# Patient Record
Sex: Male | Born: 1950 | Race: White | Hispanic: No | Marital: Married | Smoking: Never smoker
Health system: Southern US, Community
[De-identification: ages and names within clinical notes are randomized; demographics above are authoritative.]

## PROBLEM LIST (undated history)

## (undated) DIAGNOSIS — R7302 Impaired glucose tolerance (oral): Secondary | ICD-10-CM

## (undated) DIAGNOSIS — I2510 Atherosclerotic heart disease of native coronary artery without angina pectoris: Secondary | ICD-10-CM

## (undated) DIAGNOSIS — Z8679 Personal history of other diseases of the circulatory system: Secondary | ICD-10-CM

## (undated) DIAGNOSIS — C801 Malignant (primary) neoplasm, unspecified: Secondary | ICD-10-CM

## (undated) DIAGNOSIS — K5730 Diverticulosis of large intestine without perforation or abscess without bleeding: Secondary | ICD-10-CM

## (undated) DIAGNOSIS — J302 Other seasonal allergic rhinitis: Secondary | ICD-10-CM

## (undated) DIAGNOSIS — R55 Syncope and collapse: Secondary | ICD-10-CM

## (undated) DIAGNOSIS — E785 Hyperlipidemia, unspecified: Secondary | ICD-10-CM

## (undated) DIAGNOSIS — N529 Male erectile dysfunction, unspecified: Secondary | ICD-10-CM

## (undated) DIAGNOSIS — Z923 Personal history of irradiation: Secondary | ICD-10-CM

## (undated) DIAGNOSIS — M75102 Unspecified rotator cuff tear or rupture of left shoulder, not specified as traumatic: Secondary | ICD-10-CM

## (undated) DIAGNOSIS — R399 Unspecified symptoms and signs involving the genitourinary system: Secondary | ICD-10-CM

## (undated) DIAGNOSIS — J310 Chronic rhinitis: Secondary | ICD-10-CM

## (undated) DIAGNOSIS — R7303 Prediabetes: Secondary | ICD-10-CM

## (undated) DIAGNOSIS — G43909 Migraine, unspecified, not intractable, without status migrainosus: Secondary | ICD-10-CM

## (undated) DIAGNOSIS — C61 Malignant neoplasm of prostate: Secondary | ICD-10-CM

## (undated) DIAGNOSIS — Z87898 Personal history of other specified conditions: Secondary | ICD-10-CM

## (undated) DIAGNOSIS — Z8719 Personal history of other diseases of the digestive system: Secondary | ICD-10-CM

## (undated) DIAGNOSIS — M1990 Unspecified osteoarthritis, unspecified site: Secondary | ICD-10-CM

## (undated) DIAGNOSIS — Z973 Presence of spectacles and contact lenses: Secondary | ICD-10-CM

## (undated) DIAGNOSIS — E663 Overweight: Secondary | ICD-10-CM

## (undated) DIAGNOSIS — R053 Chronic cough: Secondary | ICD-10-CM

## (undated) HISTORY — DX: Prediabetes: R73.03

## (undated) HISTORY — DX: Impaired glucose tolerance (oral): R73.02

## (undated) HISTORY — DX: Hyperlipidemia, unspecified: E78.5

## (undated) HISTORY — DX: Migraine, unspecified, not intractable, without status migrainosus: G43.909

## (undated) HISTORY — PX: SPINE SURGERY: SHX786

## (undated) HISTORY — PX: ACHILLES TENDON REPAIR: SUR1153

## (undated) HISTORY — PX: TONSILLECTOMY: SUR1361

## (undated) HISTORY — DX: Other seasonal allergic rhinitis: J30.2

## (undated) HISTORY — PX: COLONOSCOPY: SHX174

## (undated) HISTORY — DX: Unspecified rotator cuff tear or rupture of left shoulder, not specified as traumatic: M75.102

## (undated) HISTORY — DX: Syncope and collapse: R55

## (undated) HISTORY — PX: VASECTOMY: SHX75

## (undated) HISTORY — DX: Male erectile dysfunction, unspecified: N52.9

## (undated) HISTORY — DX: Overweight: E66.3

---

## 1994-12-15 DIAGNOSIS — Z87442 Personal history of urinary calculi: Secondary | ICD-10-CM

## 1994-12-15 HISTORY — DX: Personal history of urinary calculi: Z87.442

## 2002-08-23 ENCOUNTER — Ambulatory Visit (HOSPITAL_COMMUNITY): Admission: RE | Admit: 2002-08-23 | Discharge: 2002-08-23 | Payer: Self-pay | Admitting: Gastroenterology

## 2006-02-17 ENCOUNTER — Ambulatory Visit (HOSPITAL_BASED_OUTPATIENT_CLINIC_OR_DEPARTMENT_OTHER): Admission: RE | Admit: 2006-02-17 | Discharge: 2006-02-17 | Payer: Self-pay | Admitting: Orthopedic Surgery

## 2006-02-17 HISTORY — PX: ACHILLES TENDON REPAIR: SUR1153

## 2009-09-14 ENCOUNTER — Encounter: Payer: Self-pay | Admitting: Emergency Medicine

## 2009-09-14 ENCOUNTER — Ambulatory Visit: Payer: Self-pay | Admitting: Diagnostic Radiology

## 2009-09-15 ENCOUNTER — Inpatient Hospital Stay (HOSPITAL_COMMUNITY): Admission: EM | Admit: 2009-09-15 | Discharge: 2009-09-17 | Payer: Self-pay | Admitting: Internal Medicine

## 2009-09-15 ENCOUNTER — Encounter (INDEPENDENT_AMBULATORY_CARE_PROVIDER_SITE_OTHER): Payer: Self-pay | Admitting: Internal Medicine

## 2009-09-17 ENCOUNTER — Encounter (INDEPENDENT_AMBULATORY_CARE_PROVIDER_SITE_OTHER): Payer: Self-pay | Admitting: Internal Medicine

## 2009-09-17 ENCOUNTER — Ambulatory Visit: Payer: Self-pay | Admitting: Vascular Surgery

## 2009-12-27 ENCOUNTER — Ambulatory Visit (HOSPITAL_COMMUNITY): Admission: RE | Admit: 2009-12-27 | Discharge: 2009-12-28 | Payer: Self-pay | Admitting: Neurosurgery

## 2009-12-27 HISTORY — PX: ANTERIOR CERVICAL DECOMP/DISCECTOMY FUSION: SHX1161

## 2010-02-14 ENCOUNTER — Encounter: Admission: RE | Admit: 2010-02-14 | Discharge: 2010-02-14 | Payer: Self-pay | Admitting: Neurosurgery

## 2011-03-02 LAB — GLUCOSE, CAPILLARY
Glucose-Capillary: 117 mg/dL — ABNORMAL HIGH (ref 70–99)
Glucose-Capillary: 141 mg/dL — ABNORMAL HIGH (ref 70–99)
Glucose-Capillary: 170 mg/dL — ABNORMAL HIGH (ref 70–99)
Glucose-Capillary: 177 mg/dL — ABNORMAL HIGH (ref 70–99)
Glucose-Capillary: 219 mg/dL — ABNORMAL HIGH (ref 70–99)

## 2011-03-02 LAB — CBC
Hemoglobin: 15.7 g/dL (ref 13.0–17.0)
MCHC: 34.8 g/dL (ref 30.0–36.0)
Platelets: 244 10*3/uL (ref 150–400)
RBC: 5.14 MIL/uL (ref 4.22–5.81)

## 2011-03-02 LAB — BASIC METABOLIC PANEL
Calcium: 8.8 mg/dL (ref 8.4–10.5)
Creatinine, Ser: 0.75 mg/dL (ref 0.4–1.5)

## 2011-03-20 LAB — COMPREHENSIVE METABOLIC PANEL
AST: 23 U/L (ref 0–37)
Albumin: 4.7 g/dL (ref 3.5–5.2)
Calcium: 9.8 mg/dL (ref 8.4–10.5)
Chloride: 103 mEq/L (ref 96–112)
Creatinine, Ser: 0.9 mg/dL (ref 0.4–1.5)
GFR calc Af Amer: >60 mL/min (ref >60)
Total Protein: 7.9 g/dL (ref 6.0–8.3)

## 2011-03-20 LAB — GLUCOSE, CAPILLARY
Glucose-Capillary: 167 mg/dL — ABNORMAL HIGH (ref 70–99)
Glucose-Capillary: 170 mg/dL — ABNORMAL HIGH (ref 70–99)
Glucose-Capillary: 176 mg/dL — ABNORMAL HIGH (ref 70–99)
Glucose-Capillary: 182 mg/dL — ABNORMAL HIGH (ref 70–99)
Glucose-Capillary: 106 mg/dL — ABNORMAL HIGH (ref 70–99)

## 2011-03-20 LAB — CBC
HCT: 41.5 % (ref 39.0–52.0)
MCHC: 34.9 g/dL (ref 30.0–36.0)
MCV: 89.3 fL (ref 78.0–100.0)
Platelets: 224 10*3/uL (ref 150–400)
Platelets: 268 10*3/uL (ref 150–400)
RBC: 4.63 MIL/uL (ref 4.22–5.81)
WBC: 7.1 10*3/uL (ref 4.0–10.5)
WBC: 19.3 10*3/uL — ABNORMAL HIGH (ref 4.0–10.5)

## 2011-03-20 LAB — CARDIAC PANEL(CRET KIN+CKTOT+MB+TROPI)
CK, MB: 1.6 ng/mL (ref 0.3–4.0)
Relative Index: 1.4 (ref 0.0–2.5)
Relative Index: 1.7 (ref 0.0–2.5)
Total CK: 127 U/L (ref 7–232)

## 2011-03-20 LAB — URINALYSIS, ROUTINE W REFLEX MICROSCOPIC
Bilirubin Urine: NEGATIVE
Nitrite: NEGATIVE
Specific Gravity, Urine: 1.021 (ref 1.005–1.030)
pH: 5.5 (ref 5.0–8.0)

## 2011-03-20 LAB — BASIC METABOLIC PANEL
BUN: 22 mg/dL (ref 6–23)
Calcium: 8.2 mg/dL — ABNORMAL LOW (ref 8.4–10.5)
Chloride: 108 mEq/L (ref 96–112)
GFR calc Af Amer: >60 mL/min (ref >60)
GFR calc non Af Amer: >60 mL/min (ref >60)
Glucose, Bld: 174 mg/dL — ABNORMAL HIGH (ref 70–99)
Potassium: 3 mEq/L — ABNORMAL LOW (ref 3.5–5.1)
Potassium: 4.1 mEq/L (ref 3.5–5.1)
Sodium: 138 mEq/L (ref 135–145)

## 2011-03-20 LAB — LIPID PANEL
Total CHOL/HDL Ratio: 3 RATIO
VLDL: 22 mg/dL (ref 0–40)

## 2011-03-20 LAB — HEMOGLOBIN A1C
Hgb A1c MFr Bld: 5.7 % (ref 4.6–6.1)
Mean Plasma Glucose: 117 mg/dL

## 2011-03-20 LAB — DIFFERENTIAL
Basophils Absolute: 0 10*3/uL (ref 0.0–0.1)
Eosinophils Absolute: 0 10*3/uL (ref 0.0–0.7)
Lymphocytes Relative: 10 % — ABNORMAL LOW (ref 12–46)
Monocytes Absolute: 1 10*3/uL (ref 0.1–1.0)
Neutrophils Relative %: 85 % — ABNORMAL HIGH (ref 43–77)

## 2011-03-20 LAB — D-DIMER, QUANTITATIVE: D-Dimer, Quant: 0.32 ug/mL-FEU (ref 0.00–0.48)

## 2011-03-20 LAB — URINE CULTURE: Colony Count: NO GROWTH

## 2011-03-20 LAB — HOMOCYSTEINE: Homocysteine: 7.4 umol/L (ref 4.0–15.4)

## 2011-03-20 LAB — POCT CARDIAC MARKERS: Troponin i, poc: <0.05 ng/mL (ref 0.00–0.09)

## 2011-03-20 LAB — LIPASE, BLOOD: Lipase: 78 U/L (ref 23–300)

## 2011-03-20 LAB — PROTIME-INR: Prothrombin Time: 12.2 seconds (ref 11.6–15.2)

## 2011-05-02 NOTE — Op Note (Signed)
NAME:  Gregory Middleton, Gregory Middleton                ACCOUNT NO.:  [id number]   MEDICAL RECORD NO.:  [id number]          PATIENT TYPE:  AMB   LOCATION:  DSC                          FACILITY:  [facility]   PHYSICIAN:  Leonides Grills, M.D.     DATE OF BIRTH:  1951-02-22   DATE OF PROCEDURE:  DATE OF DISCHARGE:                                 OPERATIVE REPORT   PREOPERATIVE DIAGNOSIS:  Left Achilles tendon rupture.   POSTOPERATIVE DIAGNOSIS:  Left Achilles tendon rupture.   OPERATION:  1.  Primary repair of left Achilles tendon.  2.  Plantaris to Achilles tendon transfer.   ANESTHESIA:  General.   ESTIMATED BLOOD LOSS:  Minimal.   TOURNIQUET TIME:  Approximately 45 minutes.   COMPLICATIONS:  None.   DISPOSITION:  Stable to PR.   INDICATIONS FOR PROCEDURE:  This is a 60 year old gentleman who sustained an  Achilles tendon rupture approximately 5 days ago. He consented for the above  procedure. All risks which included infection, neurovascular injury, re-  rupture, contracture, weakness, prolonged recovery, persistent pain were all  explained and questions were encouraged and answered.   OPERATIVE PROCEDURE:  The patient was brought to the operating room and  placed in the supine position. After adequate general endotracheal tube  anesthesia was administered as well as Ancef __________.  The patient was  then placed in a sloppy lateral position with the operative side down on a  beanbag. All bony prominences were well padded. The left lower extremity was  then prepped and draped in the sterile manner over a proximally placed thigh  tourniquet. Limb was gravity exsanguinated and the tourniquet was elevated  to 290 mmHg. A longitudinal incision over the anteromedial aspect of the  Achilles tendon was then made. Dissection was carried down through skin.  Hemostasis was obtained. The fascia was opened in line with the incision.  There was a rupture approximately 3 to 4 cm proximal to the insertion of  the  Achilles tendon. We then did a modified Krackow stitch technique using #2  fiber wire suture in both the proximal and distal ends, pulled these  together with the foot in maximum plantar flexion and repaired the  respective ends. This had outstanding repair. We then tucked in the mopped  ends with 2-0 fiber wire and again this had an outstanding repair. This was  in a running cannulated stitch. We then saw that he had also a plantaris and  we transferred the plantaris and spanned the repair with the plantaris and  repaired this with 2-0 fiber wire suture performing the plantaris to  Achilles tendon transfer/augmentation. Tourniquet was deflated.  Hemostasis was obtained. Repair was excellent and there was excellent  strength with range of motion as well. Subcu was closed with 3-0 Vicryl and  skin was closed with 4-0 nylon. Sterile dressing was applied. Modified Jones  dressing was applied with the ankle in gravity appliance. The patient was  stable to the PR.      Leonides Grills, M.D.  Electronically Signed     PB/MEDQ  D:  02/17/2006  T:  02/18/2006  Job:  [id number]

## 2011-05-02 NOTE — Op Note (Signed)
   NAME:  Gregory Middleton, Gregory Middleton                            ACCOUNT NO.:  [id number]   MEDICAL RECORD NO.:  [id number]                   PATIENT TYPE:  AMB   LOCATION:  ENDO                                 FACILITY:  [facility]   PHYSICIAN:  James L. Malon Kindle., M.D.          DATE OF BIRTH:  Mar 02, 1951   DATE OF PROCEDURE:  08/23/2002  DATE OF DISCHARGE:                                 OPERATIVE REPORT   PROCEDURE:  Colonoscopy.   MEDICATIONS:  Fentanyl 100 mcg, Versed 8 mg IV.   ENDOSCOPE:  Olympus adult video colonoscope.   INDICATIONS:  Heme-positive stool.   DESCRIPTION OF PROCEDURE:  The procedure had been explained to the patient  and consent obtained.  With the patient in the left lateral decubitus  position, the Olympus pediatric video colonoscope was inserted and advanced  under direct visualization.  The prep was excellent, and we were able to  reach the cecum without difficulty.  The ileocecal valve and appendiceal  orifice were seen.  The scope was withdrawn, and the cecum, ascending colon,  hepatic flexure, transverse colon, splenic flexure, descending, and sigmoid  colon were seen well upon removal, and no significant diverticular disease.  No polyps seen throughout the entire colon.  The scope was withdrawn.  The  patient tolerated the procedure well.   ASSESSMENT:  Heme-positive stool with essentially normal colonoscopy.   PLAN:  Will see back in the office in two months to recheck the stool.                                                 James L. Malon Kindle., M.D.    Waldron Session  D:  08/23/2002  T:  08/23/2002  Job:  [id number]   cc:   Caryn Bee L. Little, M.D.  [address]  [place]  [place] [address]  Fax: [phone]

## 2011-08-20 ENCOUNTER — Other Ambulatory Visit: Payer: Self-pay | Admitting: Neurosurgery

## 2011-08-20 DIAGNOSIS — M545 Low back pain, unspecified: Secondary | ICD-10-CM

## 2011-09-01 ENCOUNTER — Ambulatory Visit
Admission: RE | Admit: 2011-09-01 | Discharge: 2011-09-01 | Disposition: A | Discharge status: Home / Self Care | Payer: BC Managed Care – PPO | Source: Ambulatory Visit | Attending: Neurosurgery | Admitting: Neurosurgery

## 2011-09-01 DIAGNOSIS — M545 Low back pain, unspecified: Secondary | ICD-10-CM

## 2012-05-28 DIAGNOSIS — R6881 Early satiety: Secondary | ICD-10-CM | POA: Insufficient documentation

## 2012-12-15 HISTORY — PX: COLONOSCOPY: SHX174

## 2013-05-04 ENCOUNTER — Other Ambulatory Visit: Payer: Self-pay | Admitting: Otolaryngology

## 2013-05-04 DIAGNOSIS — R1313 Dysphagia, pharyngeal phase: Secondary | ICD-10-CM

## 2013-05-12 ENCOUNTER — Ambulatory Visit
Admission: RE | Admit: 2013-05-12 | Discharge: 2013-05-12 | Disposition: A | Discharge status: Home / Self Care | Payer: BC Managed Care – PPO | Source: Ambulatory Visit | Attending: Otolaryngology | Admitting: Otolaryngology

## 2013-05-12 DIAGNOSIS — R1313 Dysphagia, pharyngeal phase: Secondary | ICD-10-CM

## 2014-04-20 DIAGNOSIS — M545 Low back pain, unspecified: Secondary | ICD-10-CM | POA: Insufficient documentation

## 2014-04-20 DIAGNOSIS — G8929 Other chronic pain: Secondary | ICD-10-CM | POA: Insufficient documentation

## 2014-04-20 DIAGNOSIS — C4491 Basal cell carcinoma of skin, unspecified: Secondary | ICD-10-CM | POA: Insufficient documentation

## 2015-03-09 ENCOUNTER — Ambulatory Visit: Payer: Self-pay | Admitting: Internal Medicine

## 2015-03-13 ENCOUNTER — Encounter: Payer: Self-pay | Admitting: Internal Medicine

## 2015-03-13 ENCOUNTER — Other Ambulatory Visit: Payer: Self-pay

## 2015-03-13 ENCOUNTER — Ambulatory Visit (INDEPENDENT_AMBULATORY_CARE_PROVIDER_SITE_OTHER): Payer: 59 | Admitting: Internal Medicine

## 2015-03-13 VITALS — BP 151/91 | HR 64 | Ht 66.5 in | Wt 222.0 lb

## 2015-03-13 DIAGNOSIS — R55 Syncope and collapse: Secondary | ICD-10-CM | POA: Diagnosis not present

## 2015-03-13 NOTE — Progress Notes (Signed)
ELECTROPHYSIOLOGY CONSULT NOTE  Patient ID: Gregory Middleton, MRN: [id number], DOB/AGE: [birth year] 64 y.o. Admit date: (Not on file) Date of Consult: 03/13/2015  Primary Physician: No primary care provider on file. Primary Cardiologist: PJ  Chief Complaint: syncope   HPI Gregory Middleton is a 64 y.o. male referred because of recurrent episodes of syncope.  He recalls syncope while in the [employer] following phlebotomy. He had syncope following his Vasotec to be. He does not recall the specifics around these events.  He had a history of palpitations some years ago and saw Dr. Peter Martinique. Event recording demonstrated PACs and PVCs. Over the last 3 or 4 years he's had other episodes of "tremulousness" these last minutes and he thinks he may be related to palpitations but is not sure.  The syncopal episodes that prompted this visit include 5. The first occurred in 2010. He was in a deer stand. He had a stomach knot sensation and within 5 seconds had lost consciousness. He fell out of the tree stand about 20 feet. Thankfully he hit some saplings and did not hurt himself terribly. Evaluation at that time included CT scanning which was unrevealing. His hospitalized and monitored for a few days and these were unrevealing. An echocardiogram was done and was essentially normal. Dr. Martinique thought this was likely a vasovagal episode.  Next event occurred 2 years later. He was on counseling session in [place]. He did have knotted feeling in his stomach. He described being flushed hot and a vertiginous sensation. He laid down on the ground. Symptoms lasted about 30 minutes. He was not diaphoretic. He does not recall whether he was described as pale.  The next event occurred in 2013 while in [place]. It was extraordinarily hot. Many other coworkers got sick from heat exposure; however, his episode was quite different in that it was a) similar to his other episodes and b) had similar epiphenomena.  Around  that same time he had an episode that occurred at night. He awakened at night  went to the bathroom and urinated. He became presyncopal and barely made it back to his bed. He does not recall whether he had the abdominal nodding. However the rest of the epiphenomena were typical. Most recently in early March 2016 he was in a [employer] had had dinner that was quite filling. He again felt the discomfort in his stomach and became flushed. He had the urge to defecate. He tried to get to the bathroom and was unable to do so. He came back to the table and laid on the floor. He was described by his wife as both flushed as well as pale. Ultimately he got back to the bathroom had explosive BM and then became presyncopal again. Symptoms persisted for about 30 minutes  He denies exertional chest discomfort. He has some discomfort in his neck but is not necessarily related to exertion. He does denies exercise associated shortness of breath. He has no peripheral edema.  He has sleep problems but denies sleep disordered breathing. He complains of significant fatigue particularly over recent months. His weight is up 10 pounds over the last 10 years.   He uses alcohol regularly. He denies cigarettes or recreational drugs  His family history is concerning to him for coronary artery disease and sudden death in his grandfather  His cardiac risk factors include diabetes hypertension dyslipidemia and his family history  I suspect he has the metabolic syndrome      Echocardiogram 2010 was  normal apart from mild left atrial enlargement    Past Medical History  Diagnosis Date  . Glucose intolerance (impaired glucose tolerance)   . Left rotator cuff tear   . Seasonal allergies   . Near syncope   . Pre-diabetes   . Overweight   . ED (erectile dysfunction)   . Migraine   . Syncopal episodes   . Hyperlipidemia       Surgical History:  Past Surgical History  Procedure Laterality Date  . Tonsillectomy     . Achilles tendon repair    . Spine surgery      neck     Home Meds: Prior to Admission medications   Medication Sig Start Date End Date Taking? Authorizing Provider  fexofenadine (ALLEGRA) 180 MG tablet Take 180 mg by mouth daily.   Yes Historical Provider, MD  fluticasone (FLONASE) 50 MCG/ACT nasal spray Place 1 spray into both nostrils daily.  02/28/15  Yes Historical Provider, MD  guaiFENesin (MUCINEX) 600 MG 12 hr tablet Take 600 mg by mouth 2 (two) times daily.   Yes Historical Provider, MD  ibuprofen (ADVIL,MOTRIN) 400 MG tablet Take 400 mg by mouth every 6 (six) hours as needed for fever, headache, mild pain, moderate pain or cramping.    Yes Historical Provider, MD  metFORMIN (GLUCOPHAGE) 500 MG tablet Take 500 mg by mouth daily with breakfast.  01/22/15  Yes Historical Provider, MD  Omega-3 Fatty Acids (FISH OIL) 1000 MG CAPS Take 4 capsules by mouth daily.   Yes Historical Provider, MD  OVER THE COUNTER MEDICATION Take 1 tablet by mouth as needed (for sleep). OTC sleep aid by mouth as needed for sleep   Yes Historical Provider, MD  pantoprazole (PROTONIX) 40 MG tablet Take 40 mg by mouth daily. 02/28/15  Yes Historical Provider, MD  sildenafil (REVATIO) 20 MG tablet Take 20 mg by mouth daily as needed (for ED).    Yes Historical Provider, MD  simvastatin (ZOCOR) 40 MG tablet Take 40 mg by mouth every evening. 02/20/15  Yes Historical Provider, MD      Allergies:  Allergies  Allergen Reactions  . Ambien [Zolpidem Tartrate] Other (See Comments)    Wild behavior    History   Social History  . Marital Status: Married    Spouse Name: N/A  . Number of Children: 3  . Years of Education: N/A   Occupational History  . Self employed Company secretary Other   Social History Main Topics  . Smoking status: Never Smoker   . Smokeless tobacco: Not on file  . Alcohol Use: Yes     Comment: Ocassional wine and beer  . Drug Use: Not on file  . Sexual Activity: Not on file   Other Topics  Concern  . Not on file   Social History Narrative     Family History  Problem Relation Age of Onset  . Stroke Mother      ROS:  Please see the history of present illness.   Negative except Back pain  All other systems reviewed and negative.    Physical Exam:   Blood pressure 151/91, pulse 64, height 5' 6.5" (1.689 m), weight 222 lb (100.699 kg). General: Well developed, well nourished male in no acute distress. Head: Normocephalic, atraumatic, sclera non-icteric, no xanthomas, nares are without discharge. EENT: normal Lymph Nodes:  none Back: without scoliosis/kyphosis , no CVA tendersness Neck: Negative for carotid bruits. JVD 6-7 Lungs: Clear bilaterally to auscultation without wheezes, rales, or rhonchi. Breathing is  unlabored. Heart: RRR with S1 S2. No  murmur , rubs, or gallops appreciated. Abdomen: Soft, non-tender, non-distended with normoactive bowel sounds. No hepatomegaly. No rebound/guarding. No obvious abdominal masses. Msk:  Strength and tone appear normal for age. Extremities: No clubbing or cyanosis. No  edema.  Distal pedal pulses are 2+ and equal bilaterally. Skin: Warm and Dry Neuro: Alert and oriented X 3. CN III-XII intact Grossly normal sensory and motor function . Psych:  Responds to questions appropriately with a normal affect.      Labs: Cardiac Enzymes No results for input(s): CKTOTAL, CKMB, TROPONINI in the last 72 hours. CBC Lab Results  Component Value Date   WBC 8.5 12/25/2009   HGB 15.7 12/25/2009   HCT 45.2 12/25/2009   MCV 88.0 12/25/2009   PLT 244 12/25/2009   PROTIME: No results for input(s): LABPROT, INR in the last 72 hours. Chemistry No results for input(s): NA, K, CL, CO2, BUN, CREATININE, CALCIUM, PROT, BILITOT, ALKPHOS, ALT, AST, GLUCOSE in the last 168 hours.  Invalid input(s): LABALBU Lipids Lab Results  Component Value Date   CHOL  09/15/2009    88        ATP III CLASSIFICATION:  <200     mg/dL   Desirable  200-239   mg/dL   Borderline High  >=240    mg/dL   High          HDL 29* 09/15/2009   LDLCALC  09/15/2009    37        Total Cholesterol/HDL:CHD Risk Coronary Heart Disease Risk Table                     Men   Women  1/2 Average Risk   3.4   3.3  Average Risk       5.0   4.4  2 X Average Risk   9.6   7.1  3 X Average Risk  23.4   11.0        Use the calculated Patient Ratio above and the CHD Risk Table to determine the patient's CHD Risk.        ATP III CLASSIFICATION (LDL):  <100     mg/dL   Optimal  100-129  mg/dL   Near or Above                    Optimal  130-159  mg/dL   Borderline  160-189  mg/dL   High  >190     mg/dL   Very High   TRIG 111 09/15/2009   BNP No results found for: PROBNP Thyroid Function Tests: No results for input(s): TSH, T4TOTAL, T3FREE, THYROIDAB in the last 72 hours.  Invalid input(s): FREET3    Miscellaneous Lab Results  Component Value Date   DDIMER  09/15/2009    0.32        AT THE INHOUSE ESTABLISHED CUTOFF VALUE OF 0.48 ug/mL FEU, THIS ASSAY HAS BEEN DOCUMENTED IN THE LITERATURE TO HAVE A SENSITIVITY AND NEGATIVE PREDICTIVE VALUE OF AT LEAST 98 TO 99%.  THE TEST RESULT SHOULD BE CORRELATED WITH AN ASSESSMENT OF THE CLINICAL PROBABILITY OF DVT / VTE.    Radiology/Studies:  No results found.  EKG:  ECG: Sinus Rhythm  @64             Intervals  16/09/41  Axis -4     Assessment and Plan:   Presyncope  Diabetes  Sleep disordered breathing/fatigue  Hypertension   The patient  is had a series of spells characterized by   Stereotypical epiphenomena but without a specific identifiable trigger.  The post micturition episode, the heat triggering episode, and the persistent orthostatic intolerance at the restaurant all speak to vasomotor instability and a neurally mediated syndrome. The abdominal discomfort could be a concomitant symptom; alternatively it could be a trigger.  The fact that the patient is described as flushed bothers me  some; on the other hand his wife says that he has been pale with the same episode. There was significant associated persistent orthostatic intolerance during this spell supporting the hypothesis.  It is important to exclude cardiac disease particularly in older patients; we'll undertake an echo and a Myoview. I have given him information for  the NDRF.org website.  We have discussed the potential role of loop recording even has recurrent events.  I wonder also if he doesn't have sleep apnea. He adamantly opposes the idea but with his concomitant fatigue and "sleep problems" I would encourage him to discuss with his PCP the role of a sleep study  With his diabetes and his elevated blood pressure I would recommend an Ace or ARB area   Virl Axe

## 2015-03-14 NOTE — Patient Instructions (Signed)
Your physician recommends that you continue on your current medications as directed. Please refer to the Current Medication list given to you today.  Your physician has requested that you have an echocardiogram. Echocardiography is a painless test that uses sound waves to create images of your heart. It provides your doctor with information about the size and shape of your heart and how well your heart's chambers and valves are working. This procedure takes approximately one hour. There are no restrictions for this procedure.  Your physician has requested that you have a stress myoview. For further information please visit [facility]uge[facility]iest[facility].tn. Please follow instruction sheet as given.  Follow up to be determined after testing.

## 2015-03-26 ENCOUNTER — Ambulatory Visit (HOSPITAL_COMMUNITY): Payer: 59 | Attending: Cardiology | Admitting: Radiology

## 2015-03-26 ENCOUNTER — Ambulatory Visit (HOSPITAL_BASED_OUTPATIENT_CLINIC_OR_DEPARTMENT_OTHER): Payer: 59 | Admitting: Radiology

## 2015-03-26 DIAGNOSIS — R55 Syncope and collapse: Secondary | ICD-10-CM

## 2015-03-26 DIAGNOSIS — I340 Nonrheumatic mitral (valve) insufficiency: Secondary | ICD-10-CM | POA: Diagnosis not present

## 2015-03-26 DIAGNOSIS — I071 Rheumatic tricuspid insufficiency: Secondary | ICD-10-CM | POA: Diagnosis not present

## 2015-03-26 MED ORDER — TECHNETIUM TC 99M SESTAMIBI GENERIC - CARDIOLITE
33.0000 | Freq: Once | INTRAVENOUS | Status: AC | PRN
  Administered 2015-03-26: 33 via INTRAVENOUS

## 2015-03-26 MED ORDER — TECHNETIUM TC 99M SESTAMIBI GENERIC - CARDIOLITE
11.0000 | Freq: Once | INTRAVENOUS | Status: AC | PRN
  Administered 2015-03-26: 11 via INTRAVENOUS

## 2015-03-26 NOTE — Progress Notes (Signed)
[facility] [address] [place], [place] [address] [phone]    Cardiology Nuclear Med Study  BRAXTYN BOJARSKI is a 64 y.o. male     MRN : [id number]     DOB: 04-11-1951  Procedure Date: 03/26/2015  Nuclear Med Background Indication for Stress Test:  Evaluation for Ischemia History:  MPI 2010 (nl. per pt) Cardiac Risk Factors: Hypertension and NIDDM  Symptoms:  Near Syncope and Palpitations   Nuclear Pre-Procedure Caffeine/Decaff Intake:  None NPO After: 12:00am   Lungs:  clear O2 Sat: 97% on room air. IV 0.9% NS with Angio Cath:  22g  IV Site: R Hand  IV Started by:  Crissie Figures, RN  Chest Size (in):  50 Cup Size: n/a  Height: 5\' 6"  (1.676 m)  Weight:  213 lb (96.616 kg)  BMI:  Body mass index is 34.4 kg/(m^2). Tech Comments:  N/A    Nuclear Med Study 1 or 2 day study: 1 day  Stress Test Type:  Stress  Reading MD: N/A  Order Authorizing Provider:  Jolyn Nap, MD  Resting Radionuclide: Technetium 23m Sestamibi  Resting Radionuclide Dose: 11.0 mCi   Stress Radionuclide:  Technetium 66m Sestamibi  Stress Radionuclide Dose: 33.0 mCi           Stress Protocol Rest HR: 62 Stress HR: 153  Rest BP: 145/74 Stress BP: 235/72  Exercise Time (min): 6:00 METS: 7.0   Predicted Max HR: 157 bpm % Max HR: 97.45 bpm Rate Pressure Product: 35955   Dose of Adenosine (mg):  n/a Dose of Lexiscan: n/a mg  Dose of Atropine (mg): n/a Dose of Dobutamine: n/a mcg/kg/min (at max HR)  Stress Test Technologist: Glade Lloyd, BS-ES  Nuclear Technologist:  Earl Many, CNMT     Rest Procedure:  Myocardial perfusion imaging was performed at rest 45 minutes following the intravenous administration of Technetium 76m Sestamibi. Rest ECG: NSR - Normal EKG  Stress Procedure:  The patient exercised on the treadmill utilizing the Bruce Protocol for 6:00 minutes. The patient stopped due to fatigue and denied any chest pain.  Technetium 14m Sestamibi was injected  at peak exercise and myocardial perfusion imaging was performed after a brief delay. Stress ECG: Insignificant upsloping ST segment depression.  QPS Raw Data Images:  Normal; no motion artifact; normal heart/lung ratio. Stress Images:  Normal homogeneous uptake in all areas of the myocardium. Rest Images:  Normal homogeneous uptake in all areas of the myocardium. Subtraction (SDS):  No evidence of ischemia. Transient Ischemic Dilatation (Normal <1.22):  1.03 Lung/Heart Ratio (Normal <0.45):  0.23  Quantitative Gated Spect Images QGS EDV:  109 ml QGS ESV:  49 ml  Impression Exercise Capacity:  Good exercise capacity. BP Response:  Hypertensive blood pressure response. Clinical Symptoms:  No significant symptoms noted. ECG Impression:  No significant ST segment change suggestive of ischemia. Comparison with Prior Nuclear Study: No images to compare  Overall Impression:  Normal stress nuclear study.  LV Ejection Fraction: 56%.  LV Wall Motion:  NL LV Function; NL Wall Motion  Sanda Klein, MD, [facility] [phone] office [phone] pager

## 2015-03-26 NOTE — Progress Notes (Signed)
Echocardiogram performed.  

## 2015-04-02 ENCOUNTER — Telehealth: Payer: Self-pay | Admitting: Internal Medicine

## 2015-04-02 ENCOUNTER — Other Ambulatory Visit (HOSPITAL_COMMUNITY): Payer: 59

## 2015-04-02 NOTE — Telephone Encounter (Signed)
New message        Pt calling for test results

## 2015-04-02 NOTE — Telephone Encounter (Signed)
Spoke with patient who called to ask why he has not received echo and stress test results.  I advised patient that Dr. Caryl Comes has been out of town.  I reviewed the results with the patient with the explanation that Dr. Caryl Comes has not reviewed yet and that he may have comments/concerns in addition to the reading doctor's evaluation.  Patient verbalized understanding and agreement and thanked me for the call.

## 2015-04-16 ENCOUNTER — Telehealth: Payer: Self-pay | Admitting: Internal Medicine

## 2015-04-16 NOTE — Telephone Encounter (Signed)
New Message  Pt called to make 3/mo f/u w/ Dr. Caryl Comes per recall letter for 8/2. Pt thought, before getting the letter, that Dr. Caryl Comes did not need to f/u for a while. Pt requested to speak w/ Rn- to see if this 3 MO f/u is necessary. Please call back and discuss.

## 2015-04-17 NOTE — Telephone Encounter (Signed)
Advised patient ok to keep currently scheduled office visit in August.   Advised to call if he does experience symptoms (near syncope/syncope)  before then and we will work him to be seen.  Patient verbalized understanding and agreeable to plan.

## 2015-07-17 ENCOUNTER — Ambulatory Visit: Payer: 59 | Admitting: Internal Medicine

## 2015-08-27 ENCOUNTER — Ambulatory Visit: Payer: 59 | Admitting: Internal Medicine

## 2016-02-13 ENCOUNTER — Ambulatory Visit (INDEPENDENT_AMBULATORY_CARE_PROVIDER_SITE_OTHER): Payer: BLUE CROSS/BLUE SHIELD | Admitting: Urology

## 2016-02-13 DIAGNOSIS — R351 Nocturia: Secondary | ICD-10-CM | POA: Diagnosis not present

## 2016-02-13 DIAGNOSIS — N401 Enlarged prostate with lower urinary tract symptoms: Secondary | ICD-10-CM

## 2016-02-13 DIAGNOSIS — R972 Elevated prostate specific antigen [PSA]: Secondary | ICD-10-CM | POA: Diagnosis not present

## 2016-02-22 ENCOUNTER — Other Ambulatory Visit: Payer: Self-pay | Admitting: Urology

## 2016-02-22 DIAGNOSIS — R972 Elevated prostate specific antigen [PSA]: Secondary | ICD-10-CM

## 2016-03-15 DIAGNOSIS — C61 Malignant neoplasm of prostate: Secondary | ICD-10-CM

## 2016-03-15 HISTORY — DX: Malignant neoplasm of prostate: C61

## 2016-04-09 ENCOUNTER — Other Ambulatory Visit (HOSPITAL_COMMUNITY): Payer: BLUE CROSS/BLUE SHIELD

## 2016-04-09 ENCOUNTER — Ambulatory Visit (HOSPITAL_COMMUNITY)
Admission: RE | Admit: 2016-04-09 | Discharge: 2016-04-09 | Disposition: A | Discharge status: Home / Self Care | Payer: PPO | Source: Ambulatory Visit | Attending: Urology | Admitting: Urology

## 2016-04-09 DIAGNOSIS — C61 Malignant neoplasm of prostate: Secondary | ICD-10-CM | POA: Diagnosis not present

## 2016-04-09 DIAGNOSIS — R972 Elevated prostate specific antigen [PSA]: Secondary | ICD-10-CM

## 2016-04-09 MED ORDER — LIDOCAINE HCL (PF) 2 % IJ SOLN
10.0000 mL | Freq: Once | INTRAMUSCULAR | Status: AC
  Administered 2016-04-09: 10 mL

## 2016-04-09 MED ORDER — GENTAMICIN SULFATE 40 MG/ML IJ SOLN
80.0000 mg | Freq: Once | INTRAMUSCULAR | Status: AC
Start: 2016-04-09 — End: 2016-04-09
  Administered 2016-04-09: 80 mg via INTRAMUSCULAR

## 2016-04-09 MED ORDER — LIDOCAINE HCL (PF) 2 % IJ SOLN
INTRAMUSCULAR | Status: AC
  Administered 2016-04-09: 10 mL
  Filled 2016-04-09: qty 10

## 2016-04-09 MED ORDER — GENTAMICIN SULFATE 40 MG/ML IJ SOLN
INTRAMUSCULAR | Status: AC
  Administered 2016-04-09: 80 mg via INTRAMUSCULAR
  Filled 2016-04-09: qty 2

## 2016-04-09 NOTE — Discharge Instructions (Signed)
Transrectal Ultrasound-Guided Biopsy °A transrectal ultrasound-guided biopsy is a procedure to take samples of tissue from your prostate. Ultrasound images are used to guide the procedure. It is usually done to check the prostate gland for cancer. °BEFORE THE PROCEDURE °· Do not eat or drink after midnight on the night before your procedure. °· Take medicines as your doctor tells you. °· Your doctor may have you stop taking some medicines 5-7 days before the procedure. °· You will be given an enema before your procedure. During an enema, a liquid is put into your butt (rectum) to clear out waste. °· You may have lab tests the day of your procedure. °· Make plans to have someone drive you home. °PROCEDURE °· You will be given medicine to help you relax before the procedure. An IV tube will be put into one of your veins. It will be used to give fluids and medicine. °· You will be given medicine to reduce the risk of infection (antibiotic). °· You will be placed on your side. °· A probe with gel will be put in your butt. This is used to take pictures of your prostate and the area around it. °· A medicine to numb the area is put into your prostate. °· A biopsy needle is then inserted and guided to your prostate. °· Samples of prostate tissue are taken. The needle is removed. °· The samples are sent to a lab to be checked. Results are usually back in 2-3 days. °AFTER THE PROCEDURE °· You will be taken to a room where you will be watched until you are doing okay. °· You may have some pain in the area around your butt. You will be given medicines for this. °· You may be able to go home the same day. Sometimes, an overnight stay in the hospital is needed. °  °This information is not intended to replace advice given to you by your health care provider. Make sure you discuss any questions you have with your health care provider. °  °Document Released: 11/19/2009 Document Revised: 12/06/2013 Document Reviewed:  07/20/2013 °Elsevier Interactive Patient Education ©2016 [employer] ° °

## 2016-04-23 ENCOUNTER — Ambulatory Visit (INDEPENDENT_AMBULATORY_CARE_PROVIDER_SITE_OTHER): Payer: PPO | Admitting: Urology

## 2016-04-23 ENCOUNTER — Other Ambulatory Visit: Payer: Self-pay | Admitting: Urology

## 2016-04-23 DIAGNOSIS — N401 Enlarged prostate with lower urinary tract symptoms: Secondary | ICD-10-CM | POA: Diagnosis not present

## 2016-04-23 DIAGNOSIS — R351 Nocturia: Secondary | ICD-10-CM | POA: Diagnosis not present

## 2016-04-23 DIAGNOSIS — C61 Malignant neoplasm of prostate: Secondary | ICD-10-CM

## 2016-04-23 DIAGNOSIS — R972 Elevated prostate specific antigen [PSA]: Secondary | ICD-10-CM | POA: Diagnosis not present

## 2016-04-25 ENCOUNTER — Ambulatory Visit (HOSPITAL_COMMUNITY)
Admission: RE | Admit: 2016-04-25 | Discharge: 2016-04-25 | Disposition: A | Discharge status: Home / Self Care | Payer: PPO | Source: Ambulatory Visit | Attending: Urology | Admitting: Urology

## 2016-04-25 DIAGNOSIS — K5730 Diverticulosis of large intestine without perforation or abscess without bleeding: Secondary | ICD-10-CM | POA: Diagnosis not present

## 2016-04-25 DIAGNOSIS — C61 Malignant neoplasm of prostate: Secondary | ICD-10-CM | POA: Diagnosis not present

## 2016-04-25 DIAGNOSIS — R319 Hematuria, unspecified: Secondary | ICD-10-CM | POA: Diagnosis not present

## 2016-04-25 DIAGNOSIS — I2510 Atherosclerotic heart disease of native coronary artery without angina pectoris: Secondary | ICD-10-CM | POA: Insufficient documentation

## 2016-04-25 DIAGNOSIS — K760 Fatty (change of) liver, not elsewhere classified: Secondary | ICD-10-CM | POA: Diagnosis not present

## 2016-04-25 MED ORDER — IOPAMIDOL (ISOVUE-300) INJECTION 61%
125.0000 mL | Freq: Once | INTRAVENOUS | Status: AC | PRN
  Administered 2016-04-25: 125 mL via INTRAVENOUS

## 2016-05-01 ENCOUNTER — Ambulatory Visit: Payer: PPO | Admitting: Radiation Oncology

## 2016-05-01 ENCOUNTER — Inpatient Hospital Stay: Admission: RE | Admit: 2016-05-01 | Payer: PPO | Source: Ambulatory Visit | Admitting: Radiation Oncology

## 2016-05-01 ENCOUNTER — Institutional Professional Consult (permissible substitution): Payer: PPO | Admitting: Radiation Oncology

## 2016-05-05 ENCOUNTER — Encounter (HOSPITAL_COMMUNITY): Payer: Self-pay

## 2016-05-05 ENCOUNTER — Encounter (HOSPITAL_COMMUNITY)
Admission: RE | Admit: 2016-05-05 | Discharge: 2016-05-05 | Disposition: A | Discharge status: Home / Self Care | Payer: PPO | Source: Ambulatory Visit | Attending: Urology | Admitting: Urology

## 2016-05-05 DIAGNOSIS — C61 Malignant neoplasm of prostate: Secondary | ICD-10-CM | POA: Diagnosis not present

## 2016-05-05 HISTORY — DX: Malignant (primary) neoplasm, unspecified: C80.1

## 2016-05-05 MED ORDER — TECHNETIUM TC 99M MEDRONATE IV KIT
25.0000 | PACK | Freq: Once | INTRAVENOUS | Status: AC | PRN
  Administered 2016-05-05: 24 via INTRAVENOUS

## 2016-05-14 DIAGNOSIS — C61 Malignant neoplasm of prostate: Secondary | ICD-10-CM | POA: Diagnosis not present

## 2016-06-04 ENCOUNTER — Ambulatory Visit (INDEPENDENT_AMBULATORY_CARE_PROVIDER_SITE_OTHER): Payer: PPO | Admitting: Urology

## 2016-06-04 DIAGNOSIS — C61 Malignant neoplasm of prostate: Secondary | ICD-10-CM

## 2016-06-04 DIAGNOSIS — R351 Nocturia: Secondary | ICD-10-CM | POA: Diagnosis not present

## 2016-06-09 DIAGNOSIS — I2510 Atherosclerotic heart disease of native coronary artery without angina pectoris: Secondary | ICD-10-CM | POA: Diagnosis not present

## 2016-06-09 DIAGNOSIS — C61 Malignant neoplasm of prostate: Secondary | ICD-10-CM | POA: Diagnosis not present

## 2016-06-09 DIAGNOSIS — N281 Cyst of kidney, acquired: Secondary | ICD-10-CM | POA: Diagnosis not present

## 2016-06-09 DIAGNOSIS — K760 Fatty (change of) liver, not elsewhere classified: Secondary | ICD-10-CM | POA: Diagnosis not present

## 2016-06-09 DIAGNOSIS — E782 Mixed hyperlipidemia: Secondary | ICD-10-CM | POA: Diagnosis not present

## 2016-06-09 DIAGNOSIS — M19041 Primary osteoarthritis, right hand: Secondary | ICD-10-CM | POA: Diagnosis not present

## 2016-06-09 DIAGNOSIS — I700 Atherosclerosis of aorta: Secondary | ICD-10-CM | POA: Diagnosis not present

## 2016-06-09 DIAGNOSIS — K5730 Diverticulosis of large intestine without perforation or abscess without bleeding: Secondary | ICD-10-CM | POA: Diagnosis not present

## 2016-06-10 ENCOUNTER — Other Ambulatory Visit: Payer: Self-pay | Admitting: Urology

## 2016-06-25 DIAGNOSIS — M6281 Muscle weakness (generalized): Secondary | ICD-10-CM | POA: Diagnosis not present

## 2016-06-25 DIAGNOSIS — C61 Malignant neoplasm of prostate: Secondary | ICD-10-CM | POA: Diagnosis not present

## 2016-07-03 DIAGNOSIS — R351 Nocturia: Secondary | ICD-10-CM | POA: Diagnosis not present

## 2016-07-03 DIAGNOSIS — R278 Other lack of coordination: Secondary | ICD-10-CM | POA: Diagnosis not present

## 2016-07-03 DIAGNOSIS — C61 Malignant neoplasm of prostate: Secondary | ICD-10-CM | POA: Diagnosis not present

## 2016-07-03 DIAGNOSIS — M6281 Muscle weakness (generalized): Secondary | ICD-10-CM | POA: Diagnosis not present

## 2016-07-08 DIAGNOSIS — C61 Malignant neoplasm of prostate: Secondary | ICD-10-CM | POA: Diagnosis not present

## 2016-07-17 ENCOUNTER — Encounter (HOSPITAL_COMMUNITY): Payer: Self-pay

## 2016-07-17 ENCOUNTER — Encounter (HOSPITAL_COMMUNITY)
Admission: RE | Admit: 2016-07-17 | Discharge: 2016-07-17 | Disposition: A | Discharge status: Home / Self Care | Payer: PPO | Source: Ambulatory Visit | Attending: Urology | Admitting: Urology

## 2016-07-17 DIAGNOSIS — C61 Malignant neoplasm of prostate: Secondary | ICD-10-CM | POA: Diagnosis not present

## 2016-07-17 DIAGNOSIS — Z01818 Encounter for other preprocedural examination: Secondary | ICD-10-CM | POA: Diagnosis not present

## 2016-07-17 DIAGNOSIS — Z01812 Encounter for preprocedural laboratory examination: Secondary | ICD-10-CM | POA: Diagnosis not present

## 2016-07-17 DIAGNOSIS — E119 Type 2 diabetes mellitus without complications: Secondary | ICD-10-CM | POA: Diagnosis not present

## 2016-07-17 DIAGNOSIS — Z0183 Encounter for blood typing: Secondary | ICD-10-CM | POA: Diagnosis not present

## 2016-07-17 LAB — CBC
HEMATOCRIT: 43.4 % (ref 39.0–52.0)
Hemoglobin: 15.2 g/dL (ref 13.0–17.0)
MCH: 31 pg (ref 26.0–34.0)
MCHC: 35 g/dL (ref 30.0–36.0)
MCV: 88.4 fL (ref 78.0–100.0)
Platelets: 211 10*3/uL (ref 150–400)
RBC: 4.91 MIL/uL (ref 4.22–5.81)
RDW: 13 % (ref 11.5–15.5)
WBC: 6.6 10*3/uL (ref 4.0–10.5)

## 2016-07-17 LAB — BASIC METABOLIC PANEL
Anion gap: 6 (ref 5–15)
BUN: 27 mg/dL — AB (ref 6–20)
CHLORIDE: 109 mmol/L (ref 101–111)
CO2: 25 mmol/L (ref 22–32)
Calcium: 9 mg/dL (ref 8.9–10.3)
Creatinine, Ser: 0.98 mg/dL (ref 0.61–1.24)
GFR calc Af Amer: >60 mL/min (ref >60)
GFR calc non Af Amer: >60 mL/min (ref >60)
Glucose, Bld: 122 mg/dL — ABNORMAL HIGH (ref 65–99)
POTASSIUM: 4.1 mmol/L (ref 3.5–5.1)
SODIUM: 140 mmol/L (ref 135–145)

## 2016-07-17 LAB — TYPE AND SCREEN
ABO/RH(D): O POS
Antibody Screen: NEGATIVE

## 2016-07-17 LAB — ABO/RH: ABO/RH(D): O POS

## 2016-07-17 NOTE — Pre-Procedure Instructions (Signed)
EKG done today. Echo, Stress 4'16 Epic.

## 2016-07-17 NOTE — Patient Instructions (Addendum)
AVRUM RAMP  07/17/2016   Your procedure is scheduled on: 07/28/16    Report to [facility] Main  Entrance take [place]  elevators to 3rd floor to  [place] at    [place] AM.  Call this number if you have problems the morning of surgery [phone]   Remember: ONLY 1 PERSON MAY GO WITH YOU TO SHORT STAY TO GET  READY MORNING OF [place].  Do not eat food or drink liquids :After Midnight.              Magnesium Citrate - 8-10 ounces by 12 noon day before surgery(Drink clear liquids plentiful day of bowel prep).                Fleets enema night before surgery               Follow Bowel Prep Instructions per MD.    CLEAR LIQUID DIET   Foods Allowed                                                                     Foods Excluded  Coffee and tea, regular and decaf                             liquids that you cannot  Plain Jell-O in any flavor                                             see through such as: Fruit ices (not with fruit pulp)                                     milk, soups, orange juice  Iced Popsicles                                    All solid food Carbonated beverages, regular and diet                                    Cranberry, grape and apple juices Sports drinks like Gatorade Lightly seasoned clear broth or consume(fat free) Sugar, honey syrup   _____________________________________________________________________       Take these medicines the morning of surgery with A SIP OF WATER: Allegra,  Mucinex. protonix, simvastatin. DO NOT TAKE ANY DIABETIC MEDICATIONS DAY OF YOUR SURGERY                               You may not have any metal on your body including hair pins and              piercings  Do not wear jewelry, , lotions, powders or perfumes, deodorant  Men may shave face and neck.   Do not bring valuables to the hospital. [place].  Contacts, dentures or bridgework may not be worn into surgery.  Leave suitcase in the car. After surgery it may be brought to your room.         Special Instructions: coughing and deep breathing exercises, leg exercises               Please read over the following fact sheets you were given: _____________________________________________________________________             [facility] - Preparing for Surgery Before surgery, you can play an important role.  Because skin is not sterile, your skin needs to be as free of germs as possible.  You can reduce the number of germs on your skin by washing with CHG (chlorahexidine gluconate) soap before surgery.  CHG is an antiseptic cleaner which kills germs and bonds with the skin to continue killing germs even after washing. Please DO NOT use if you have an allergy to CHG or antibacterial soaps.  If your skin becomes reddened/irritated stop using the CHG and inform your nurse when you arrive at Short Stay. Do not shave (including legs and underarms) for at least 48 hours prior to the first CHG shower.  You may shave your face/neck. Please follow these instructions carefully:  1.  Shower with CHG Soap the night before surgery and the  morning of Surgery.  2.  If you choose to wash your hair, wash your hair first as usual with your  normal  shampoo.  3.  After you shampoo, rinse your hair and body thoroughly to remove the  shampoo.                           4.  Use CHG as you would any other liquid soap.  You can apply chg directly  to the skin and wash                       Gently with a scrungie or clean washcloth.  5.  Apply the CHG Soap to your body ONLY FROM THE NECK DOWN.   Do not use on face/ open                           Wound or open sores. Avoid contact with eyes, ears mouth and genitals (private parts).                       Wash face,  Genitals (private parts) with your normal soap.             6.  Wash thoroughly, paying  special attention to the area where your surgery  will be performed.  7.  Thoroughly rinse your body with warm water from the neck down.  8.  DO NOT shower/wash with your normal soap after using and rinsing off  the CHG Soap.                9.  Pat yourself dry with a clean towel.            10.  Wear clean pajamas.            11.  Place clean sheets  on your bed the night of your first shower and do not  sleep with pets. Day of Surgery : Do not apply any lotions/deodorants the morning of surgery.  Please wear clean clothes to the hospital/surgery center.  FAILURE TO FOLLOW THESE INSTRUCTIONS MAY RESULT IN THE CANCELLATION OF YOUR SURGERY PATIENT SIGNATURE_________________________________  NURSE SIGNATURE__________________________________  ________________________________________________________________________  WHAT IS A BLOOD TRANSFUSION? Blood Transfusion Information  A transfusion is the replacement of blood or some of its parts. Blood is made up of multiple cells which provide different functions.  Red blood cells carry oxygen and are used for blood loss replacement.  White blood cells fight against infection.  Platelets control bleeding.  Plasma helps clot blood.  Other blood products are available for specialized needs, such as hemophilia or other clotting disorders. BEFORE THE TRANSFUSION  Who gives blood for transfusions?   Healthy volunteers who are fully evaluated to make sure their blood is safe. This is blood bank blood. Transfusion therapy is the safest it has ever been in the practice of medicine. Before blood is taken from a donor, a complete history is taken to make sure that person has no history of diseases nor engages in risky social behavior (examples are intravenous drug use or sexual activity with multiple partners). The donor's travel history is screened to minimize risk of transmitting infections, such as malaria. The donated blood is tested for signs of  infectious diseases, such as HIV and hepatitis. The blood is then tested to be sure it is compatible with you in order to minimize the chance of a transfusion reaction. If you or a relative donates blood, this is often done in anticipation of surgery and is not appropriate for emergency situations. It takes many days to process the donated blood. RISKS AND COMPLICATIONS Although transfusion therapy is very safe and saves many lives, the main dangers of transfusion include:   Getting an infectious disease.  Developing a transfusion reaction. This is an allergic reaction to something in the blood you were given. Every precaution is taken to prevent this. The decision to have a blood transfusion has been considered carefully by your caregiver before blood is given. Blood is not given unless the benefits outweigh the risks. AFTER THE TRANSFUSION  Right after receiving a blood transfusion, you will usually feel much better and more energetic. This is especially true if your red blood cells have gotten low (anemic). The transfusion raises the level of the red blood cells which carry oxygen, and this usually causes an energy increase.  The nurse administering the transfusion will monitor you carefully for complications. HOME CARE INSTRUCTIONS  No special instructions are needed after a transfusion. You may find your energy is better. Speak with your caregiver about any limitations on activity for underlying diseases you may have. SEEK MEDICAL CARE IF:   Your condition is not improving after your transfusion.  You develop redness or irritation at the intravenous (IV) site. SEEK IMMEDIATE MEDICAL CARE IF:  Any of the following symptoms occur over the next 12 hours:  Shaking chills.  You have a temperature by mouth above 102 F (38.9 C), not controlled by medicine.  Chest, back, or muscle pain.  People around you feel you are not acting correctly or are confused.  Shortness of breath or  difficulty breathing.  Dizziness and fainting.  You get a rash or develop hives.  You have a decrease in urine output.  Your urine turns a dark color or changes to pink, red,  or brown. Any of the following symptoms occur over the next 10 days:  You have a temperature by mouth above 102 F (38.9 C), not controlled by medicine.  Shortness of breath.  Weakness after normal activity.  The white part of the eye turns yellow (jaundice).  You have a decrease in the amount of urine or are urinating less often.  Your urine turns a dark color or changes to pink, red, or brown. Document Released: 11/28/2000 Document Revised: 02/23/2012 Document Reviewed: 07/17/2008 ExitCare Patient Information 2014 [place].  _______________________________________________________________________  Incentive Spirometer  An incentive spirometer is a tool that can help keep your lungs clear and active. This tool measures how well you are filling your lungs with each breath. Taking long deep breaths may help reverse or decrease the chance of developing breathing (pulmonary) problems (especially infection) following:  A long period of time when you are unable to move or be active. BEFORE THE PROCEDURE   If the spirometer includes an indicator to show your best effort, your nurse or respiratory therapist will set it to a desired goal.  If possible, sit up straight or lean slightly forward. Try not to slouch.  Hold the incentive spirometer in an upright position. INSTRUCTIONS FOR USE  1. Sit on the edge of your bed if possible, or sit up as far as you can in bed or on a chair. 2. Hold the incentive spirometer in an upright position. 3. Breathe out normally. 4. Place the mouthpiece in your mouth and seal your lips tightly around it. 5. Breathe in slowly and as deeply as possible, raising the piston or the ball toward the top of the column. 6. Hold your breath for 3-5 seconds or for as long as  possible. Allow the piston or ball to fall to the bottom of the column. 7. Remove the mouthpiece from your mouth and breathe out normally. 8. Rest for a few seconds and repeat Steps 1 through 7 at least 10 times every 1-2 hours when you are awake. Take your time and take a few normal breaths between deep breaths. 9. The spirometer may include an indicator to show your best effort. Use the indicator as a goal to work toward during each repetition. 10. After each set of 10 deep breaths, practice coughing to be sure your lungs are clear. If you have an incision (the cut made at the time of surgery), support your incision when coughing by placing a pillow or rolled up towels firmly against it. Once you are able to get out of bed, walk around indoors and cough well. You may stop using the incentive spirometer when instructed by your caregiver.  RISKS AND COMPLICATIONS  Take your time so you do not get dizzy or light-headed.  If you are in pain, you may need to take or ask for pain medication before doing incentive spirometry. It is harder to take a deep breath if you are having pain. AFTER USE  Rest and breathe slowly and easily.  It can be helpful to keep track of a log of your progress. Your caregiver can provide you with a simple table to help with this. If you are using the spirometer at home, follow these instructions: [place] IF:   You are having difficultly using the spirometer.  You have trouble using the spirometer as often as instructed.  Your pain medication is not giving enough relief while using the spirometer.  You develop fever of 100.5 F (38.1 C) or  higher. SEEK IMMEDIATE MEDICAL CARE IF:   You cough up bloody sputum that had not been present before.  You develop fever of 102 F (38.9 C) or greater.  You develop worsening pain at or near the incision site. MAKE SURE YOU:   Understand these instructions.  Will watch your condition.  Will get help right  away if you are not doing well or get worse. Document Released: 04/13/2007 Document Revised: 02/23/2012 Document Reviewed: 06/14/2007 [facility] Patient Information 2014 [place], [place].   ________________________________________________________________________

## 2016-07-18 LAB — HEMOGLOBIN A1C
HEMOGLOBIN A1C: 5.9 % — AB (ref 4.8–5.6)
Mean Plasma Glucose: 123 mg/dL

## 2016-07-18 NOTE — Progress Notes (Signed)
07-18-16 0800 labs viewable in Epic-note BUN

## 2016-07-28 ENCOUNTER — Inpatient Hospital Stay (HOSPITAL_COMMUNITY): Payer: PPO | Admitting: Certified Registered Nurse Anesthetist

## 2016-07-28 ENCOUNTER — Encounter (HOSPITAL_COMMUNITY)
Admission: RE | Disposition: A | Discharge status: Home / Self Care | Payer: Self-pay | Source: Ambulatory Visit | Attending: Urology

## 2016-07-28 ENCOUNTER — Inpatient Hospital Stay (HOSPITAL_COMMUNITY)
Admission: RE | Admit: 2016-07-28 | Discharge: 2016-07-29 | DRG: 708 | Disposition: A | Discharge status: Home / Self Care | Payer: PPO | Source: Ambulatory Visit | Attending: Urology | Admitting: Urology

## 2016-07-28 ENCOUNTER — Encounter (HOSPITAL_COMMUNITY): Payer: Self-pay | Admitting: *Deleted

## 2016-07-28 DIAGNOSIS — Z87442 Personal history of urinary calculi: Secondary | ICD-10-CM

## 2016-07-28 DIAGNOSIS — K219 Gastro-esophageal reflux disease without esophagitis: Secondary | ICD-10-CM | POA: Diagnosis present

## 2016-07-28 DIAGNOSIS — Z79899 Other long term (current) drug therapy: Secondary | ICD-10-CM

## 2016-07-28 DIAGNOSIS — Z833 Family history of diabetes mellitus: Secondary | ICD-10-CM | POA: Diagnosis not present

## 2016-07-28 DIAGNOSIS — C61 Malignant neoplasm of prostate: Principal | ICD-10-CM | POA: Diagnosis present

## 2016-07-28 HISTORY — PX: ROBOT ASSISTED LAPAROSCOPIC RADICAL PROSTATECTOMY: SHX5141

## 2016-07-28 HISTORY — PX: LYMPHADENECTOMY: SHX5960

## 2016-07-28 LAB — GLUCOSE, CAPILLARY
GLUCOSE-CAPILLARY: 134 mg/dL — AB (ref 65–99)
GLUCOSE-CAPILLARY: 193 mg/dL — AB (ref 65–99)
GLUCOSE-CAPILLARY: 139 mg/dL — AB (ref 65–99)
Glucose-Capillary: 156 mg/dL — ABNORMAL HIGH (ref 65–99)

## 2016-07-28 LAB — HEMOGLOBIN AND HEMATOCRIT, BLOOD
HCT: 44.5 % (ref 39.0–52.0)
Hemoglobin: 15.3 g/dL (ref 13.0–17.0)

## 2016-07-28 SURGERY — PROSTATECTOMY, RADICAL, ROBOT-ASSISTED, LAPAROSCOPIC
Anesthesia: General

## 2016-07-28 MED ORDER — FLUTICASONE PROPIONATE 50 MCG/ACT NA SUSP
2.0000 | Freq: Every day | NASAL | Status: DC
  Administered 2016-07-28: 2 via NASAL
  Filled 2016-07-28: qty 16

## 2016-07-28 MED ORDER — BUPIVACAINE-EPINEPHRINE (PF) 0.25% -1:200000 IJ SOLN
INTRAMUSCULAR | Status: AC
  Filled 2016-07-28: qty 30

## 2016-07-28 MED ORDER — ROCURONIUM BROMIDE 100 MG/10ML IV SOLN
INTRAVENOUS | Status: AC
  Filled 2016-07-28: qty 1

## 2016-07-28 MED ORDER — CEFAZOLIN SODIUM-DEXTROSE 2-4 GM/100ML-% IV SOLN
INTRAVENOUS | Status: AC
  Filled 2016-07-28: qty 100

## 2016-07-28 MED ORDER — HYDRALAZINE HCL 20 MG/ML IJ SOLN
INTRAMUSCULAR | Status: AC
  Filled 2016-07-28: qty 1

## 2016-07-28 MED ORDER — OXYCODONE HCL 5 MG PO TABS: 5.0000 mg | ORAL_TABLET | Freq: Once | ORAL | Status: DC | PRN

## 2016-07-28 MED ORDER — CEFAZOLIN IN D5W 1 GM/50ML IV SOLN
1.0000 g | Freq: Three times a day (TID) | INTRAVENOUS | Status: AC
  Administered 2016-07-28 (×2): 1 g via INTRAVENOUS
  Filled 2016-07-28 (×2): qty 50

## 2016-07-28 MED ORDER — ACETAMINOPHEN 10 MG/ML IV SOLN
INTRAVENOUS | Status: DC | PRN
  Administered 2016-07-28: 1000 mg via INTRAVENOUS

## 2016-07-28 MED ORDER — LIDOCAINE HCL (CARDIAC) 20 MG/ML IV SOLN
INTRAVENOUS | Status: DC | PRN
  Administered 2016-07-28: 50 mg via INTRAVENOUS

## 2016-07-28 MED ORDER — INSULIN ASPART 100 UNIT/ML ~~LOC~~ SOLN
0.0000 [IU] | SUBCUTANEOUS | Status: DC
  Administered 2016-07-28 (×2): 3 [IU] via SUBCUTANEOUS
  Administered 2016-07-28 – 2016-07-29 (×2): 2 [IU] via SUBCUTANEOUS
  Administered 2016-07-29: 3 [IU] via SUBCUTANEOUS

## 2016-07-28 MED ORDER — SODIUM CHLORIDE 0.9 % IJ SOLN
INTRAMUSCULAR | Status: AC
  Filled 2016-07-28: qty 10

## 2016-07-28 MED ORDER — LACTATED RINGERS IV SOLN
INTRAVENOUS | Status: DC
  Administered 2016-07-28 (×2): via INTRAVENOUS

## 2016-07-28 MED ORDER — ROCURONIUM BROMIDE 100 MG/10ML IV SOLN
INTRAVENOUS | Status: DC | PRN
  Administered 2016-07-28: 20 mg via INTRAVENOUS
  Administered 2016-07-28: 10 mg via INTRAVENOUS
  Administered 2016-07-28 (×2): 20 mg via INTRAVENOUS
  Administered 2016-07-28: 50 mg via INTRAVENOUS

## 2016-07-28 MED ORDER — LACTATED RINGERS IV SOLN
INTRAVENOUS | Status: DC | PRN
  Administered 2016-07-28: 08:00:00

## 2016-07-28 MED ORDER — SUGAMMADEX SODIUM 500 MG/5ML IV SOLN
INTRAVENOUS | Status: AC
  Filled 2016-07-28: qty 5

## 2016-07-28 MED ORDER — PANTOPRAZOLE SODIUM 40 MG PO TBEC
40.0000 mg | DELAYED_RELEASE_TABLET | Freq: Every morning | ORAL | Status: DC
  Administered 2016-07-28 – 2016-07-29 (×2): 40 mg via ORAL
  Filled 2016-07-28 (×2): qty 1

## 2016-07-28 MED ORDER — LORATADINE 10 MG PO TABS
10.0000 mg | ORAL_TABLET | Freq: Every day | ORAL | Status: DC
  Administered 2016-07-28 – 2016-07-29 (×2): 10 mg via ORAL
  Filled 2016-07-28 (×2): qty 1

## 2016-07-28 MED ORDER — DOCUSATE SODIUM 100 MG PO CAPS
100.0000 mg | ORAL_CAPSULE | Freq: Two times a day (BID) | ORAL | Status: DC
  Administered 2016-07-28 – 2016-07-29 (×3): 100 mg via ORAL
  Filled 2016-07-28 (×3): qty 1

## 2016-07-28 MED ORDER — ACETAMINOPHEN 325 MG PO TABS: 650.0000 mg | ORAL_TABLET | ORAL | Status: DC | PRN

## 2016-07-28 MED ORDER — MEPERIDINE HCL 50 MG/ML IJ SOLN
12.5000 mg | Freq: Once | INTRAMUSCULAR | Status: AC
  Administered 2016-07-28: 12.5 mg via INTRAVENOUS

## 2016-07-28 MED ORDER — ONDANSETRON HCL 4 MG/2ML IJ SOLN
INTRAMUSCULAR | Status: AC
  Filled 2016-07-28: qty 2

## 2016-07-28 MED ORDER — FENTANYL CITRATE (PF) 250 MCG/5ML IJ SOLN
INTRAMUSCULAR | Status: AC
  Filled 2016-07-28: qty 5

## 2016-07-28 MED ORDER — LIDOCAINE HCL (CARDIAC) 20 MG/ML IV SOLN
INTRAVENOUS | Status: AC
  Filled 2016-07-28: qty 5

## 2016-07-28 MED ORDER — HYDROMORPHONE HCL 1 MG/ML IJ SOLN
INTRAMUSCULAR | Status: AC
  Filled 2016-07-28: qty 1

## 2016-07-28 MED ORDER — INSULIN ASPART 100 UNIT/ML ~~LOC~~ SOLN: 0.0000 [IU] | SUBCUTANEOUS | Status: DC

## 2016-07-28 MED ORDER — HYDRALAZINE HCL 20 MG/ML IJ SOLN
INTRAMUSCULAR | Status: DC | PRN
  Administered 2016-07-28: 2 mg via INTRAVENOUS

## 2016-07-28 MED ORDER — KETOROLAC TROMETHAMINE 15 MG/ML IJ SOLN
INTRAMUSCULAR | Status: AC
Start: 2016-07-28 — End: 2016-07-28
  Filled 2016-07-28: qty 1

## 2016-07-28 MED ORDER — HEPARIN SODIUM (PORCINE) 1000 UNIT/ML IJ SOLN
INTRAMUSCULAR | Status: AC
Start: 2016-07-28 — End: 2016-07-28
  Filled 2016-07-28: qty 1

## 2016-07-28 MED ORDER — MIDAZOLAM HCL 2 MG/2ML IJ SOLN
INTRAMUSCULAR | Status: AC
  Filled 2016-07-28: qty 2

## 2016-07-28 MED ORDER — DIPHENHYDRAMINE HCL 50 MG/ML IJ SOLN: 12.5000 mg | Freq: Four times a day (QID) | INTRAMUSCULAR | Status: DC | PRN

## 2016-07-28 MED ORDER — SUGAMMADEX SODIUM 200 MG/2ML IV SOLN
INTRAVENOUS | Status: DC | PRN
  Administered 2016-07-28: 300 mg via INTRAVENOUS

## 2016-07-28 MED ORDER — OXYCODONE HCL 5 MG/5ML PO SOLN
5.0000 mg | Freq: Once | ORAL | Status: DC | PRN
  Filled 2016-07-28: qty 5

## 2016-07-28 MED ORDER — SULFAMETHOXAZOLE-TRIMETHOPRIM 800-160 MG PO TABS: 1.0000 | ORAL_TABLET | Freq: Two times a day (BID) | ORAL | 0 refills | Status: AC

## 2016-07-28 MED ORDER — SODIUM CHLORIDE 0.9 % IV BOLUS (SEPSIS)
1000.0000 mL | Freq: Once | INTRAVENOUS | Status: AC
  Administered 2016-07-28: 1000 mL via INTRAVENOUS

## 2016-07-28 MED ORDER — ONDANSETRON HCL 4 MG/2ML IJ SOLN
INTRAMUSCULAR | Status: DC | PRN
  Administered 2016-07-28: 4 mg via INTRAVENOUS

## 2016-07-28 MED ORDER — SIMVASTATIN 40 MG PO TABS
40.0000 mg | ORAL_TABLET | Freq: Every day | ORAL | Status: DC
  Administered 2016-07-28: 40 mg via ORAL
  Filled 2016-07-28: qty 1

## 2016-07-28 MED ORDER — MIDAZOLAM HCL 5 MG/5ML IJ SOLN
INTRAMUSCULAR | Status: DC | PRN
  Administered 2016-07-28: 2 mg via INTRAVENOUS

## 2016-07-28 MED ORDER — MEPERIDINE HCL 25 MG/ML IJ SOLN
INTRAMUSCULAR | Status: AC
  Filled 2016-07-28: qty 0

## 2016-07-28 MED ORDER — HYDROMORPHONE HCL 1 MG/ML IJ SOLN
0.2500 mg | INTRAMUSCULAR | Status: DC | PRN
  Administered 2016-07-28 (×3): 0.5 mg via INTRAVENOUS

## 2016-07-28 MED ORDER — SODIUM CHLORIDE 0.9 % IJ SOLN
INTRAMUSCULAR | Status: AC
Start: 2016-07-28 — End: 2016-07-28
  Filled 2016-07-28: qty 10

## 2016-07-28 MED ORDER — PROPOFOL 10 MG/ML IV BOLUS
INTRAVENOUS | Status: AC
  Filled 2016-07-28: qty 20

## 2016-07-28 MED ORDER — DEXAMETHASONE SODIUM PHOSPHATE 10 MG/ML IJ SOLN
INTRAMUSCULAR | Status: DC | PRN
  Administered 2016-07-28: 10 mg via INTRAVENOUS

## 2016-07-28 MED ORDER — HYDROCODONE-ACETAMINOPHEN 5-325 MG PO TABS: 1.0000 | ORAL_TABLET | ORAL | 0 refills | Status: DC | PRN

## 2016-07-28 MED ORDER — SODIUM CHLORIDE 0.9 % IR SOLN
Status: DC | PRN
  Administered 2016-07-28: 1000 mL via INTRAVESICAL

## 2016-07-28 MED ORDER — PROPOFOL 10 MG/ML IV BOLUS
INTRAVENOUS | Status: DC | PRN
  Administered 2016-07-28: 160 mg via INTRAVENOUS

## 2016-07-28 MED ORDER — MORPHINE SULFATE (PF) 2 MG/ML IV SOLN
2.0000 mg | INTRAVENOUS | Status: DC | PRN
  Administered 2016-07-28: 4 mg via INTRAVENOUS
  Filled 2016-07-28: qty 2

## 2016-07-28 MED ORDER — PROMETHAZINE HCL 25 MG/ML IJ SOLN: 6.2500 mg | INTRAMUSCULAR | Status: DC | PRN

## 2016-07-28 MED ORDER — CEFAZOLIN SODIUM-DEXTROSE 2-4 GM/100ML-% IV SOLN
2.0000 g | INTRAVENOUS | Status: AC
  Administered 2016-07-28: 2 g via INTRAVENOUS

## 2016-07-28 MED ORDER — SUCCINYLCHOLINE CHLORIDE 20 MG/ML IJ SOLN
INTRAMUSCULAR | Status: DC | PRN
  Administered 2016-07-28: 100 mg via INTRAVENOUS

## 2016-07-28 MED ORDER — BUPIVACAINE-EPINEPHRINE 0.25% -1:200000 IJ SOLN
INTRAMUSCULAR | Status: DC | PRN
  Administered 2016-07-28: 30 mL

## 2016-07-28 MED ORDER — POTASSIUM CHLORIDE IN NACL 20-0.45 MEQ/L-% IV SOLN
INTRAVENOUS | Status: DC
  Administered 2016-07-28 – 2016-07-29 (×3): via INTRAVENOUS
  Filled 2016-07-28 (×3): qty 1000

## 2016-07-28 MED ORDER — ACETAMINOPHEN 10 MG/ML IV SOLN
INTRAVENOUS | Status: AC
  Filled 2016-07-28: qty 100

## 2016-07-28 MED ORDER — KETOROLAC TROMETHAMINE 15 MG/ML IJ SOLN
15.0000 mg | Freq: Four times a day (QID) | INTRAMUSCULAR | Status: DC
  Administered 2016-07-28 – 2016-07-29 (×4): 15 mg via INTRAVENOUS
  Filled 2016-07-28 (×3): qty 1

## 2016-07-28 MED ORDER — DEXAMETHASONE SODIUM PHOSPHATE 10 MG/ML IJ SOLN
INTRAMUSCULAR | Status: AC
  Filled 2016-07-28: qty 1

## 2016-07-28 MED ORDER — FENTANYL CITRATE (PF) 100 MCG/2ML IJ SOLN
INTRAMUSCULAR | Status: DC | PRN
Start: 2016-07-28 — End: 2016-07-28
  Administered 2016-07-28 (×9): 50 ug via INTRAVENOUS

## 2016-07-28 MED ORDER — DIPHENHYDRAMINE HCL 12.5 MG/5ML PO ELIX: 12.5000 mg | ORAL_SOLUTION | Freq: Four times a day (QID) | ORAL | Status: DC | PRN

## 2016-07-28 SURGICAL SUPPLY — 52 items
APPLICATOR COTTON TIP 6IN STRL (MISCELLANEOUS) ×3 IMPLANT
CATH FOLEY 2WAY SLVR 18FR 30CC (CATHETERS) ×3 IMPLANT
CATH ROBINSON RED A/P 16FR (CATHETERS) ×3 IMPLANT
CATH ROBINSON RED A/P 8FR (CATHETERS) ×3 IMPLANT
CATH TIEMANN FOLEY 18FR 5CC (CATHETERS) ×3 IMPLANT
CHLORAPREP W/TINT 26ML (MISCELLANEOUS) ×3 IMPLANT
CLIP LIGATING HEM O LOK PURPLE (MISCELLANEOUS) ×9 IMPLANT
COVER SURGICAL LIGHT HANDLE (MISCELLANEOUS) ×3 IMPLANT
COVER TIP SHEARS 8 DVNC (MISCELLANEOUS) ×2 IMPLANT
COVER TIP SHEARS 8MM DA VINCI (MISCELLANEOUS) ×1
CUTTER ECHEON FLEX ENDO 45 340 (ENDOMECHANICALS) ×3 IMPLANT
DECANTER SPIKE VIAL GLASS SM (MISCELLANEOUS) ×3 IMPLANT
DRAPE ARM DVNC X/XI (DISPOSABLE) ×8 IMPLANT
DRAPE COLUMN DVNC XI (DISPOSABLE) ×2 IMPLANT
DRAPE DA VINCI XI ARM (DISPOSABLE) ×4
DRAPE DA VINCI XI COLUMN (DISPOSABLE) ×1
DRAPE SURG IRRIG POUCH 19X23 (DRAPES) ×3 IMPLANT
DRSG TEGADERM 4X4.75 (GAUZE/BANDAGES/DRESSINGS) ×3 IMPLANT
ELECT REM PT RETURN 9FT ADLT (ELECTROSURGICAL) ×3
ELECTRODE REM PT RTRN 9FT ADLT (ELECTROSURGICAL) ×2 IMPLANT
GLOVE BIO SURGEON STRL SZ 6.5 (GLOVE) ×3 IMPLANT
GLOVE BIOGEL M STRL SZ7.5 (GLOVE) ×6 IMPLANT
GOWN STRL REUS W/TWL LRG LVL3 (GOWN DISPOSABLE) ×9 IMPLANT
HOLDER FOLEY CATH W/STRAP (MISCELLANEOUS) ×3 IMPLANT
IRRIG SUCT STRYKERFLOW 2 WTIP (MISCELLANEOUS) ×3
IRRIGATION SUCT STRKRFLW 2 WTP (MISCELLANEOUS) ×2 IMPLANT
IV LACTATED RINGERS 1000ML (IV SOLUTION) ×3 IMPLANT
LIQUID BAND (GAUZE/BANDAGES/DRESSINGS) IMPLANT
NDL SAFETY ECLIPSE 18X1.5 (NEEDLE) ×2 IMPLANT
NEEDLE HYPO 18GX1.5 SHARP (NEEDLE) ×3
PACK ROBOT UROLOGY CUSTOM (CUSTOM PROCEDURE TRAY) ×3 IMPLANT
RELOAD GREEN ECHELON 45 (STAPLE) ×3 IMPLANT
SEAL CANN UNIV 5-8 DVNC XI (MISCELLANEOUS) ×8 IMPLANT
SEAL XI 5MM-8MM UNIVERSAL (MISCELLANEOUS) ×4
SOLUTION ELECTROLUBE (MISCELLANEOUS) ×3 IMPLANT
SUT ETHILON 3 0 PS 1 (SUTURE) ×3 IMPLANT
SUT MNCRL 3 0 RB1 (SUTURE) ×2 IMPLANT
SUT MNCRL 3 0 VIOLET RB1 (SUTURE) ×2 IMPLANT
SUT MNCRL AB 4-0 PS2 18 (SUTURE) ×6 IMPLANT
SUT MONOCRYL 3 0 RB1 (SUTURE) ×2
SUT VIC AB 0 CT1 27 (SUTURE) ×2
SUT VIC AB 0 CT1 27XBRD ANTBC (SUTURE) ×2 IMPLANT
SUT VIC AB 0 UR5 27 (SUTURE) ×3 IMPLANT
SUT VIC AB 2-0 SH 27 (SUTURE) ×2
SUT VIC AB 2-0 SH 27X BRD (SUTURE) ×2 IMPLANT
SUT VICRYL 0 UR6 27IN ABS (SUTURE) ×6 IMPLANT
SYR 27GX1/2 1ML LL SAFETY (SYRINGE) ×3 IMPLANT
TOWEL OR 17X26 10 PK STRL BLUE (TOWEL DISPOSABLE) ×3 IMPLANT
TOWEL OR NON WOVEN STRL DISP B (DISPOSABLE) ×3 IMPLANT
TUBING INSUFFLATION 10FT LAP (TUBING) IMPLANT
WATER STERILE IRR 1000ML POUR (IV SOLUTION) ×3 IMPLANT
WATER STERILE IRR 1500ML POUR (IV SOLUTION) IMPLANT

## 2016-07-28 NOTE — H&P (Signed)
Office Visit Report     07/08/2016   --------------------------------------------------------------------------------   Gregory Middleton  MRN: [id number]  PRIMARY CARE:  Judd Lien, MD  DOB: [birth year], 65 year old Male  REFERRING:  Judd Lien, MD  SSN: -[id number]  PROVIDER:  [name], M.D.    LOCATION:  [facility] [phone]   --------------------------------------------------------------------------------   CC/HPI: CC: Prostate Cancer   Physician requesting consult: Dr. Nicolette Bang  PCP: Dr. Judd Lien   Gregory Middleton is a 65 year old gentleman who was noted to have a persistent elevation of his PSA of 6.99. This prompted a TRUS biopsy of the prostate on 04/09/16 demonstrating Gleason 4+5=9 adenocarcinoma of the prostate with 6 out of 12 biopsy cores positive for maligancy (all on the left side of the prostate).   Family history: None   Imaging studies: CT scan of the abdomen and pelvis (04/25/16) - negative for metastatic disease, CAD; Bone scan (05/05/16) - negative for metastatic disease   PMH: He has a history of GERD.  PSH: No abdominal surgery.   TNM stage: cT1c N0 M0  PSA: 6.99  Gleason score: 4+5=9  Biopsy (04/09/16): 6/12 cores positive  Left: L lateral apex (5%, 3+3=6), L apex (20%, 4+5=9), L lateral mid (50%, 4+3=7, PNI), L mid (50%, 4+3=7), L lateral base (60%, 4+3=7), L base (40%, 4+3=7)  Right: Benign  Prostate volume: 36.5 cc   Nomogram  OC disease: 21%  EPE: 76%  SVI: 21%  LNI: 22%  PFS (5 year, 10 year): 43%,29%   Urinary function: IPSS is 16.  Erectile function: SHIM score is 23 (uses sildenafil 60 mg).     ALLERGIES: Ambien TABS    MEDICATIONS: Allegra 180 MG TABS Oral  Fish Oil CAPS Oral  Flonase 50 MCG/ACT Nasal Suspension Nasal  Ibuprofen TABS Oral  MetFORMIN HCl - 500 MG Oral Tablet Oral  Mucinex 600 MG Oral Tablet Extended Release 12 Hour Oral  Pantoprazole Sodium 40 MG Oral Tablet Delayed  Release Oral  Sildenafil Citrate 20 MG Oral Tablet Oral  Simvastatin 40 MG Oral Tablet Oral     GU PSH: None     PSH Notes: Tonsillectomy, Neck Surgery   NON-GU PSH: Remove Tonsils - 02/13/2016    GU PMH: Prostate Cancer - 06/04/2016, Prostate Cancer, Prostate cancer - 04/23/2016 BPH w/LUTS, Benign prostatic hypertrophy with lower urinary tract symptoms (LUTS) - 04/24/2016 Elevated PSA, PSA elevation - 04/24/2016 Nocturia, Nocturia - 04/24/2016 Personal Hx urinary calculi, History of renal calculi - 02/13/2016    NON-GU PMH: Personal history of other diseases of the digestive system, History of esophageal reflux - 02/13/2016    FAMILY HISTORY: Diabetes - Runs In Family   SOCIAL HISTORY: Marital Status: Married Current Smoking Status: Patient has never smoked.  Social Drinker.  Drinks 2 caffeinated drinks per day.    REVIEW OF SYSTEMS:    GU Review Male:   Patient reports hard to postpone urination and get up at night to urinate. Patient denies frequent urination, burning/ pain with urination, leakage of urine, stream starts and stops, trouble starting your streams, and have to strain to urinate .  Gastrointestinal (Lower):   Patient denies diarrhea and constipation.  Gastrointestinal (Upper):   Patient denies nausea and vomiting.  Constitutional:   Patient denies fever, night sweats, weight loss, and fatigue.  Skin:   Patient denies skin rash/ lesion and itching.  Eyes:   Patient denies blurred vision and double vision.  Ears/ Nose/ Throat:  Patient denies sore throat and sinus problems.  Hematologic/Lymphatic:   Patient denies swollen glands and easy bruising.  Cardiovascular:   Patient denies leg swelling and chest pains.  Respiratory:   Patient denies cough and shortness of breath.  Endocrine:   Patient denies excessive thirst.  Musculoskeletal:   Patient denies back pain and joint pain.  Neurological:   Patient denies headaches and dizziness.  Psychologic:   Patient denies  anxiety and depression.   VITAL SIGNS:      07/08/2016 08:58 AM  BP 149/75 mmHg  Pulse 61 /min   GU PHYSICAL EXAMINATION:    Prostate: 45 cc. He does have significant firmness on the left side of the prostate and the contour of the prostate is not easily appreciated on the left side compared to the right possibly consistent with locally advanced disease.   MULTI-SYSTEM PHYSICAL EXAMINATION:    Constitutional: Well-nourished. No physical deformities. Normally developed. Good grooming.  Neck: Neck symmetrical, not swollen. Normal tracheal position.  Respiratory: No labored breathing, no use of accessory muscles.   Cardiovascular: Normal temperature, normal extremity pulses, no swelling, no varicosities.  Lymphatic: No enlargement of neck, axillae, groin.  Skin: No paleness, no jaundice, no cyanosis. No lesion, no ulcer, no rash.  Neurologic / Psychiatric: Oriented to time, oriented to place, oriented to person. No depression, no anxiety, no agitation.  Gastrointestinal: No mass, no tenderness, no rigidity, non obese abdomen.  Eyes: Normal conjunctivae. Normal eyelids.  Ears, Nose, Mouth, and Throat: Left ear no scars, no lesions, no masses. Right ear no scars, no lesions, no masses. Nose no scars, no lesions, no masses. Normal hearing. Normal lips.  Musculoskeletal: Normal gait and station of head and neck.     PAST DATA REVIEWED:  Source Of History:  Patient  Lab Test Review:   PSA  Records Review:   Pathology Reports, Previous Patient Records  Urine Test Review:   Urinalysis  X-Ray Review: C.T. Abdomen/Pelvis: Reviewed Films.  Bone Scan: Reviewed Films.     02/13/16  PSA  Total PSA 6.99   Free PSA 0.83   % Free PSA 12     PROCEDURES:          Urinalysis - 81003 Dipstick Dipstick Cont'd  Specimen: Voided Bilirubin: Neg  Color: Yellow Ketones: Neg  Appearance: Clear Blood: Neg  Specific Gravity: 1.020 Protein: Neg  pH: 5.5 Urobilinogen: 0.2  Glucose: Neg Nitrites: Neg     Leukocyte Esterase: Neg    ASSESSMENT:      ICD-10 Details  1 GU:   Prostate Cancer - C61    PLAN:           Schedule Return Visit: Keep Scheduled Appointment          Document Letter(s):  Created for Patient: Clinical Summary         Notes:   1. Prostate cancer: I had a detailed discussion with Rev. Brackeen and his wife today. We discussed the high-risk nature of his prostate cancer and the likely need for multimodality therapy.   The patient was counseled about the natural history of prostate cancer and the standard treatment options that are available for prostate cancer. It was explained to him how his age and life expectancy, clinical stage, Gleason score, and PSA affect his prognosis, the decision to proceed with additional staging studies, as well as how that information influences recommended treatment strategies. We discussed the roles for active surveillance, radiation therapy, surgical therapy, androgen deprivation, as well as  ablative therapy options for the treatment of prostate cancer as appropriate to his individual cancer situation. We discussed the risks and benefits of these options with regard to their impact on cancer control and also in terms of potential adverse events, complications, and impact on quality of life particularly related to urinary and sexual function. The patient was encouraged to ask questions throughout the discussion today and all questions were answered to his stated satisfaction. In addition, the patient was provided with and/or directed to appropriate resources and literature for further education about prostate cancer and treatment options.   We discussed surgical therapy for prostate cancer including the different available surgical approaches. We discussed, in detail, the risks and expectations of surgery with regard to cancer control, urinary control, and erectile function as well as the expected postoperative recovery process. Additional risks of  surgery including but not limited to bleeding, infection, hernia formation, nerve damage, lymphocele formation, bowel/rectal injury potentially necessitating colostomy, damage to the urinary tract resulting in urine leakage, urethral stricture, and the cardiopulmonary risks such as myocardial infarction, stroke, death, venothromboembolism, etc. were explained. The risk of open surgical conversion for robotic/laparoscopic prostatectomy was also discussed.   All questions were answered to his stated satisfaction.   He wishes to avoid radiation therapy and hormonal therapy primarily to avoid androgen deprivation. He understands that there is still a likely chance that he may need adjuvant or salvage therapy with radiation and/or androgen deprivation. He accepts this risk and expresses his understanding.   He is scheduled to proceed with a unilateral right nerve sparing robot-assisted laparoscopic radical prostatectomy and pelvic lymphadenectomy.   Cc: Dr. Rosie Fate  Dr. Judd Lien  Dr. Tyler Pita         E & M CODE: I spent at least 40 minutes face to face with the patient, more than 50% of that time was spent on counseling and/or coordinating care.     * Signed by [name], M.D. on 07/08/16 at 5:21 PM (EDT)*

## 2016-07-28 NOTE — Progress Notes (Signed)
Post-op note  Subjective: The patient is doing well.  No complaints.  Mild nausea no vomiting  Objective: Vital signs in last 24 hours: Temp:  [97.4 F (36.3 C)-97.8 F (36.6 C)] 97.4 F (36.3 C) (08/14 1036) Pulse Rate:  [57-76] 61 (08/14 1130) Resp:  [16-23] 18 (08/14 1130) BP: (143-167)/(70-91) 167/91 (08/14 1130) SpO2:  [92 %-100 %] 92 % (08/14 1130) Weight:  [97.5 kg (215 lb)] 97.5 kg (215 lb) (08/14 0557)  Intake/Output from previous day: No intake/output data recorded. Intake/Output this shift: Total I/O In: 2101 [I.V.:2001; IV Piggyback:100] Out: 280 [Drains:30; Blood:250]  Physical Exam:  General: Alert and oriented. Abdomen: Soft, Nondistended. Incisions: Clean and dry. Urine: pink  Lab Results:  Recent Labs  07/28/16 1111  HGB 15.3  HCT 44.5    Assessment/Plan: POD#0   1) Continue to monitor  2) DVT prophy, clears, IS, amb, pain control   LOS: 0 days   Draken Farrior 07/28/2016, 3:13 PM

## 2016-07-28 NOTE — Op Note (Signed)
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 Robotic assisted laparoscopic radical prostatectomy (right nerve sparing) 2. Bilateral robotic assisted laparoscopic pelvic lymphadenectomy  Surgeon: Pryor Curia. M.D.  Assistant(s): Debbrah Alar, PA-C  Anesthesia: General  Complications: None  EBL: 250 mL  IVF:  1300 mL crystalloid  Specimens: 1. Prostate and seminal vesicles 2. Right pelvic lymph nodes 3. Left pelvic lymph nodes  Disposition of specimens: Pathology  Drains: 1. 20 Fr coude catheter 2. # 19 Blake pelvic drain  Indication: Gregory Middleton is a 65 y.o. patient with clinically localized prostate cancer.  After a thorough review of the management options for treatment of prostate cancer, he elected to proceed with surgical therapy and the above procedure(s).  We have discussed the potential benefits and risks of the procedure, side effects of the proposed treatment, the likelihood of the patient achieving the goals of the procedure, and any potential problems that might occur during the procedure or recuperation. Informed consent has been obtained.  Description of procedure:  The patient was taken to the operating room and a general anesthetic was administered. He was given preoperative antibiotics, placed in the dorsal lithotomy position, and prepped and draped in the usual sterile fashion. Next a preoperative timeout was performed. A urethral catheter was placed into the bladder and a site was selected near the umbilicus for placement of the camera port. This was placed using a standard open Hassan technique which allowed entry into the peritoneal cavity under direct vision and without difficulty. An 8 mm port was placed and a pneumoperitoneum established. The camera was then used to  inspect the abdomen and there was no evidence of any intra-abdominal injuries or other abnormalities. The remaining abdominal ports were then placed. 8 mm robotic ports were placed in the right lower quadrant, left lower quadrant, and far left lateral abdominal wall. A 5 mm port was placed in the right upper quadrant and a 12 mm port was placed in the right lateral abdominal wall for laparoscopic assistance. All ports were placed under direct vision without difficulty. The surgical cart was then docked.   Utilizing the cautery scissors, the bladder was reflected posteriorly allowing entry into the space of Retzius and identification of the endopelvic fascia and prostate. The periprostatic fat was then removed from the prostate allowing full exposure of the endopelvic fascia. The endopelvic fascia was then incised from the apex back to the base of the prostate bilaterally and the underlying levator muscle fibers were swept laterally off the prostate thereby isolating the dorsal venous complex. The dorsal vein was then stapled and divided with a 45 mm Flex Echelon stapler. Attention then turned to the bladder neck which was divided anteriorly thereby allowing entry into the bladder and exposure of the urethral catheter. The catheter balloon was deflated and the catheter was brought into the operative field and used to retract the prostate anteriorly. The posterior bladder neck was then examined and was divided allowing further dissection between the bladder and prostate posteriorly until the vasa deferentia and seminal vessels were identified. The vasa deferentia were isolated, divided, and lifted anteriorly. The seminal vesicles were dissected down to their tips with care to control the seminal vascular arterial blood supply. These structures were then lifted anteriorly and the space between Denonvillier's fascia and the anterior rectum was developed with a combination of sharp  and blunt dissection. This isolated  the vascular pedicles of the prostate.  The lateral prostatic fascia on the right side of the prostate was then sharply incised allowing release of the neurovascular bundle. The vascular pedicle of the prostate on the right side was then ligated with Weck clips between the prostate and neurovascular bundle and divided with sharp cold scissor dissection resulting in neurovascular bundle preservation. On the left side, a wide non nerve sparing dissection was performed with Weck clips used to ligate the vascular pedicle of the prostate. The neurovascular bundle on the right side was then separated off the apex of the prostate and urethra.  The urethra was then sharply transected allowing the prostate specimen to be disarticulated. The pelvis was copiously irrigated and hemostasis was ensured. There was no evidence for rectal injury.  Attention then turned to the right pelvic sidewall. The fibrofatty tissue between the external iliac vein, confluence of the iliac vessels, hypogastric artery, and Cooper's ligament was dissected free from the pelvic sidewall with care to preserve the obturator nerve. Weck clips were used for lymphostasis and hemostasis. An identical procedure was performed on the contralateral side and the lymphatic packets were removed for permanent pathologic analysis.  Attention then turned to the urethral anastomosis. A 2-0 Vicryl slip knot was placed between Denonvillier's fascia, the posterior bladder neck, and the posterior urethra to reapproximate these structures. A double-armed 3-0 Monocryl suture was then used to perform a 360 running tension-free anastomosis between the bladder neck and urethra. A new urethral catheter was then placed into the bladder and irrigated. There were no blood clots within the bladder and the anastomosis appeared to be watertight. A #19 Blake drain was then brought through the left lateral 8 mm port site and positioned appropriately within the pelvis. It was  secured to the skin with a nylon suture. The surgical cart was then undocked. The right lateral 12 mm port site was closed at the fascial level with a 0 Vicryl suture placed laparoscopically. All remaining ports were then removed under direct vision. The prostate specimen was removed intact within the Endopouch retrieval bag via the periumbilical camera port site. This fascial opening was closed with two running 0 Vicryl sutures. 0.25% Marcaine was then injected into all port sites and all incisions were reapproximated at the skin level with 4-0 Monocryl subcuticular sutures. Dermabond was applied. The patient appeared to tolerate the procedure well and without complications. The patient was able to be extubated and transferred to the recovery unit in satisfactory condition.   Pryor Curia MD

## 2016-07-28 NOTE — Anesthesia Preprocedure Evaluation (Signed)
Anesthesia Evaluation  Patient identified by MRN, date of birth, ID band Patient awake    Reviewed: Allergy & Precautions, H&P , NPO status , Patient's Chart, lab work & pertinent test results  History of Anesthesia Complications Negative for: history of anesthetic complications  Airway Mallampati: II  TM Distance: >3 FB Neck ROM: full    Dental no notable dental hx.    Pulmonary neg pulmonary ROS,    Pulmonary exam normal breath sounds clear to auscultation       Cardiovascular Normal cardiovascular exam Rhythm:regular Rate:Normal  Negative stress test 03/2015, Echo normal   Neuro/Psych  Headaches,    GI/Hepatic negative GI ROS, Neg liver ROS,   Endo/Other  negative endocrine ROS  Renal/GU negative Renal ROS     Musculoskeletal   Abdominal   Peds  Hematology negative hematology ROS (+)   Anesthesia Other Findings   Reproductive/Obstetrics negative OB ROS                             Anesthesia Physical Anesthesia Plan  ASA: II  Anesthesia Plan: General   Post-op Pain Management:    Induction: Intravenous  Airway Management Planned: Oral ETT  Additional Equipment:   Intra-op Plan:   Post-operative Plan: Extubation in OR  Informed Consent: I have reviewed the patients History and Physical, chart, labs and discussed the procedure including the risks, benefits and alternatives for the proposed anesthesia with the patient or authorized representative who has indicated his/her understanding and acceptance.   Dental Advisory Given  Plan Discussed with: Anesthesiologist, CRNA and Surgeon  Anesthesia Plan Comments: (Hgb 15)        Anesthesia Quick Evaluation

## 2016-07-28 NOTE — Anesthesia Postprocedure Evaluation (Signed)
Anesthesia Post Note  Patient: ABIOLA FADDIS  Procedure(s) Performed: Procedure(s) (LRB): XI ROBOTIC ASSISTED LAPAROSCOPIC RADICAL PROSTATECTOMY (N/A) LYMPHADENECTOMY (Bilateral)  Patient location during evaluation: PACU Anesthesia Type: General Level of consciousness: awake and alert Pain management: pain level controlled Vital Signs Assessment: post-procedure vital signs reviewed and stable Respiratory status: spontaneous breathing, nonlabored ventilation, respiratory function stable and patient connected to nasal cannula oxygen Cardiovascular status: blood pressure returned to baseline and stable Postop Assessment: no signs of nausea or vomiting Anesthetic complications: no    Last Vitals:  Vitals:   07/28/16 1115 07/28/16 1130  BP: (!) 165/78 (!) 167/91  Pulse: 61 61  Resp: (!) 23 18  Temp:      Last Pain:  Vitals:   07/28/16 1200  TempSrc:   PainSc: Asleep                 Zenaida Deed

## 2016-07-28 NOTE — Transfer of Care (Signed)
Immediate Anesthesia Transfer of Care Note  Patient: Gregory Middleton  Procedure(s) Performed: Procedure(s): XI ROBOTIC ASSISTED LAPAROSCOPIC RADICAL PROSTATECTOMY (N/A) LYMPHADENECTOMY (Bilateral)  Patient Location: PACU  Anesthesia Type:General  Level of Consciousness:  sedated, patient cooperative and responds to stimulation  Airway & Oxygen Therapy:Patient Spontanous Breathing and Patient connected to face mask oxgen  Post-op Assessment:  Report given to PACU RN and Post -op Vital signs reviewed and stable  Post vital signs:  Reviewed and stable  Last Vitals:  Vitals:   07/28/16 0543  BP: (!) 162/85  Pulse: 76  Resp: 18  Temp: [id number] C    Complications: No apparent anesthesia complications

## 2016-07-28 NOTE — Interval H&P Note (Signed)
History and Physical Interval Note:  07/28/2016 7:00 AM  Gregory Middleton  has presented today for surgery, with the diagnosis of PROSTATE CANCER  The various methods of treatment have been discussed with the patient and family. After consideration of risks, benefits and other options for treatment, the patient has consented to  Procedure(s): XI ROBOTIC [place] (N/A) LYMPHADENECTOMY (Bilateral) as a surgical intervention .  The patient's history has been reviewed, patient examined, no change in status, stable for surgery.  I have reviewed the patient's chart and labs.  Questions were answered to the patient's satisfaction.     [name],LES

## 2016-07-28 NOTE — Anesthesia Procedure Notes (Signed)
Procedure Name: Intubation Date/Time: 07/28/2016 7:25 AM Performed by: West Pugh Pre-anesthesia Checklist: Patient identified, Emergency Drugs available, Suction available, Patient being monitored and Timeout performed Patient Re-evaluated:Patient Re-evaluated prior to inductionOxygen Delivery Method: Circle system utilized Preoxygenation: Pre-oxygenation with 100% oxygen Intubation Type: IV induction Ventilation: Mask ventilation without difficulty and Oral airway inserted - appropriate to patient size Laryngoscope Size: Mac and 4 Grade View: Grade II Tube type: Oral Tube size: 7.5 mm Number of attempts: 1 Airway Equipment and Method: Stylet Placement Confirmation: ETT inserted through vocal cords under direct vision,  positive ETCO2,  CO2 detector and breath sounds checked- equal and bilateral Secured at: 23 cm Tube secured with: Tape Dental Injury: Teeth and Oropharynx as per pre-operative assessment

## 2016-07-28 NOTE — Discharge Instructions (Signed)

## 2016-07-29 LAB — HEMOGLOBIN AND HEMATOCRIT, BLOOD
HCT: 39.2 % (ref 39.0–52.0)
HEMOGLOBIN: 14 g/dL (ref 13.0–17.0)

## 2016-07-29 LAB — GLUCOSE, CAPILLARY
Glucose-Capillary: 129 mg/dL — ABNORMAL HIGH (ref 65–99)
Glucose-Capillary: 160 mg/dL — ABNORMAL HIGH (ref 65–99)

## 2016-07-29 MED ORDER — HYDROCODONE-ACETAMINOPHEN 5-325 MG PO TABS
1.0000 | ORAL_TABLET | Freq: Four times a day (QID) | ORAL | Status: DC | PRN
  Administered 2016-07-29: 1 via ORAL
  Filled 2016-07-29: qty 1

## 2016-07-29 MED ORDER — BISACODYL 10 MG RE SUPP
10.0000 mg | Freq: Once | RECTAL | Status: AC
  Administered 2016-07-29: 10 mg via RECTAL
  Filled 2016-07-29: qty 1

## 2016-07-29 NOTE — Discharge Summary (Signed)
  Date of admission: 07/28/2016  Date of discharge: 07/29/2016  Admission diagnosis: Prostate Cancer  Discharge diagnosis: Prostate Cancer  History and Physical: For full details, please see admission history and physical. Briefly, Gregory Middleton is a 65 y.o. gentleman with localized prostate cancer.  After discussing management/treatment options, he elected to proceed with surgical treatment.  Hospital Course: Gregory Middleton was taken to the operating room on 07/28/2016 and underwent a robotic assisted laparoscopic radical prostatectomy. He tolerated this procedure well and without complications. Postoperatively, he was able to be transferred to a regular hospital room following recovery from anesthesia.  He was able to begin ambulating the night of surgery. He remained hemodynamically stable overnight.  He had excellent urine output with appropriately minimal output from his pelvic drain and his pelvic drain was removed on POD #1.  He was transitioned to oral pain medication, tolerated a clear liquid diet, and had met all discharge criteria and was able to be discharged home later on POD#1.  Laboratory values:  Recent Labs  07/28/16 1111 07/29/16 0539  HGB 15.3 14.0  HCT 44.5 39.2    Disposition: Home  Discharge instruction: He was instructed to be ambulatory but to refrain from heavy lifting, strenuous activity, or driving. He was instructed on urethral catheter care.  Discharge medications:    Medication List    STOP taking these medications   Fish Oil 1000 MG Caps   ibuprofen 400 MG tablet Commonly known as:  ADVIL,MOTRIN   OVER THE COUNTER MEDICATION   sildenafil 20 MG tablet Commonly known as:  REVATIO     TAKE these medications   diphenhydrAMINE 25 MG tablet Commonly known as:  BENADRYL Take 25 mg by mouth every 6 (six) hours as needed (takes 2 , 50 mg total).   fexofenadine 60 MG tablet Commonly known as:  ALLEGRA Take 120 mg by mouth daily.   fluticasone 50  MCG/ACT nasal spray Commonly known as:  FLONASE Place 2 sprays into both nostrils at bedtime.   guaiFENesin 600 MG 12 hr tablet Commonly known as:  MUCINEX Take 600 mg by mouth daily.   HYDROcodone-acetaminophen 5-325 MG tablet Commonly known as:  NORCO Take 1 tablet by mouth every 4 (four) hours as needed for moderate pain.   metFORMIN 500 MG tablet Commonly known as:  GLUCOPHAGE Take 500 mg by mouth daily with breakfast.   neomycin-bacitracin-polymyxin ointment Commonly known as:  NEOSPORIN Apply 1 application topically 2 (two) times daily as needed for wound care. apply to eye   oxymetazoline 0.05 % nasal spray Commonly known as:  AFRIN Place 1 spray into both nostrils 2 (two) times daily as needed for congestion.   pantoprazole 40 MG tablet Commonly known as:  PROTONIX Take 40 mg by mouth every morning.   simvastatin 40 MG tablet Commonly known as:  ZOCOR Take 40 mg by mouth every morning.   sulfamethoxazole-trimethoprim 800-160 MG tablet Commonly known as:  BACTRIM DS,SEPTRA DS Take 1 tablet by mouth 2 (two) times daily. Start day prior to catheter removal. Start taking on:  08/03/2016   tetrahydrozoline 0.05 % ophthalmic solution Place 1 drop into both eyes 2 (two) times daily as needed (Dry eyes).       Followup: He will followup in 1 week for catheter removal and to discuss his surgical pathology results.

## 2016-07-29 NOTE — Care Management Note (Signed)
Case Management Note  Patient Details  Name: Gregory Middleton MRN: [id number] Date of Birth: [birth year]  Subjective/Objective: 65 y/o m admitted w/Prostate Ca. From home.                   Action/Plan:d/c home no needs or orders.   Expected Discharge Date:                  Expected Discharge Plan:     In-House Referral:     Discharge planning Services  CM Consult  Post Acute Care Choice:    Choice offered to:     DME Arranged:    DME Agency:     HH Arranged:    [place] Agency:     Status of Service:  Completed, signed off  If discussed at [employer] of Stay Meetings, dates discussed:    Additional Comments:  Dessa Phi, RN 07/29/2016, 1:38 PM

## 2016-07-29 NOTE — Progress Notes (Signed)
Patient ID: Gregory Middleton, male   DOB: March 31, 1951, 65 y.o.   MRN: [id number]  1 Day Post-Op Subjective: The patient is doing well.  No nausea or vomiting. Pain is adequately controlled.  Objective: Vital signs in last 24 hours: Temp:  [97.1 F (36.2 C)-98.7 F (37.1 C)] 98.2 F (36.8 C) (08/15 0508) Pulse Rate:  [57-94] 79 (08/15 0508) Resp:  [16-23] 17 (08/15 0508) BP: (103-167)/(59-91) 103/64 (08/15 0508) SpO2:  [92 %-100 %] 97 % (08/15 0508) Weight:  [97.5 kg (214 lb 15.2 oz)] 97.5 kg (214 lb 15.2 oz) (08/14 1300)  Intake/Output from previous day: 08/14 0701 - 08/15 0700 In: 3901 [P.O.:420; I.V.:3281; IV Piggyback:200] Out: 2770 [Urine:2350; Drains:170; Blood:250] Intake/Output this shift: No intake/output data recorded.  Physical Exam:  General: Alert and oriented. CV: RRR Lungs: Clear bilaterally. GI: Soft, Nondistended. Incisions: Clean, dry, and intact Urine: Clear Extremities: Nontender, no erythema, no edema.  Lab Results:  Recent Labs  07/28/16 1111 07/29/16 0539  HGB 15.3 14.0  HCT 44.5 39.2      Assessment/Plan: POD# 1 s/p robotic prostatectomy.  1) SL IVF 2) Ambulate, Incentive spirometry 3) Transition to oral pain medication 4) Dulcolax suppository 5) D/C pelvic drain 6) Plan for likely discharge later today   Pryor Curia. MD   LOS: 1 day   Kimon Loewen,LES 07/29/2016, 7:49 AM

## 2016-07-29 NOTE — Progress Notes (Signed)
Patient has been educated on discharge instructions, including leg bag and incision care with no further questions.

## 2016-08-01 ENCOUNTER — Encounter (HOSPITAL_COMMUNITY): Payer: Self-pay | Admitting: Urology

## 2016-08-04 DIAGNOSIS — R7301 Impaired fasting glucose: Secondary | ICD-10-CM | POA: Diagnosis not present

## 2016-08-04 DIAGNOSIS — E782 Mixed hyperlipidemia: Secondary | ICD-10-CM | POA: Diagnosis not present

## 2016-08-04 DIAGNOSIS — K760 Fatty (change of) liver, not elsewhere classified: Secondary | ICD-10-CM | POA: Diagnosis not present

## 2016-08-08 ENCOUNTER — Ambulatory Visit (HOSPITAL_COMMUNITY)
Admission: RE | Admit: 2016-08-08 | Discharge: 2016-08-08 | Disposition: A | Discharge status: Home / Self Care | Payer: PPO | Source: Ambulatory Visit | Attending: Urology | Admitting: Urology

## 2016-08-08 ENCOUNTER — Other Ambulatory Visit (HOSPITAL_COMMUNITY): Payer: Self-pay | Admitting: Urology

## 2016-08-08 DIAGNOSIS — Z6833 Body mass index (BMI) 33.0-33.9, adult: Secondary | ICD-10-CM | POA: Diagnosis not present

## 2016-08-08 DIAGNOSIS — R7301 Impaired fasting glucose: Secondary | ICD-10-CM | POA: Diagnosis not present

## 2016-08-08 DIAGNOSIS — E782 Mixed hyperlipidemia: Secondary | ICD-10-CM | POA: Diagnosis not present

## 2016-08-08 DIAGNOSIS — I700 Atherosclerosis of aorta: Secondary | ICD-10-CM | POA: Diagnosis not present

## 2016-08-08 DIAGNOSIS — M79604 Pain in right leg: Secondary | ICD-10-CM | POA: Diagnosis not present

## 2016-08-08 DIAGNOSIS — Z1389 Encounter for screening for other disorder: Secondary | ICD-10-CM | POA: Diagnosis not present

## 2016-08-08 DIAGNOSIS — M19041 Primary osteoarthritis, right hand: Secondary | ICD-10-CM | POA: Diagnosis not present

## 2016-08-08 DIAGNOSIS — F5101 Primary insomnia: Secondary | ICD-10-CM | POA: Diagnosis not present

## 2016-08-08 DIAGNOSIS — R52 Pain, unspecified: Secondary | ICD-10-CM

## 2016-08-08 DIAGNOSIS — I2510 Atherosclerotic heart disease of native coronary artery without angina pectoris: Secondary | ICD-10-CM | POA: Diagnosis not present

## 2016-08-08 DIAGNOSIS — C61 Malignant neoplasm of prostate: Secondary | ICD-10-CM | POA: Diagnosis not present

## 2016-08-08 NOTE — Progress Notes (Signed)
Preliminary results by tech - Right Lower Ext. Venous Duplex Completed. Negative for deep and superficial vein thrombosis and Baker's Cyst in the right lower extremity.  Oda Cogan, BS, RDMS, RVT

## 2016-08-25 DIAGNOSIS — N393 Stress incontinence (female) (male): Secondary | ICD-10-CM | POA: Diagnosis not present

## 2016-08-25 DIAGNOSIS — R278 Other lack of coordination: Secondary | ICD-10-CM | POA: Diagnosis not present

## 2016-08-25 DIAGNOSIS — M6281 Muscle weakness (generalized): Secondary | ICD-10-CM | POA: Diagnosis not present

## 2016-09-08 DIAGNOSIS — N393 Stress incontinence (female) (male): Secondary | ICD-10-CM | POA: Diagnosis not present

## 2016-09-08 DIAGNOSIS — R278 Other lack of coordination: Secondary | ICD-10-CM | POA: Diagnosis not present

## 2016-09-08 DIAGNOSIS — M6281 Muscle weakness (generalized): Secondary | ICD-10-CM | POA: Diagnosis not present

## 2016-09-29 DIAGNOSIS — C61 Malignant neoplasm of prostate: Secondary | ICD-10-CM | POA: Diagnosis not present

## 2016-09-29 DIAGNOSIS — R278 Other lack of coordination: Secondary | ICD-10-CM | POA: Diagnosis not present

## 2016-09-29 DIAGNOSIS — N393 Stress incontinence (female) (male): Secondary | ICD-10-CM | POA: Diagnosis not present

## 2016-09-29 DIAGNOSIS — M6281 Muscle weakness (generalized): Secondary | ICD-10-CM | POA: Diagnosis not present

## 2016-10-06 DIAGNOSIS — S90211A Contusion of right great toe with damage to nail, initial encounter: Secondary | ICD-10-CM | POA: Diagnosis not present

## 2016-10-06 DIAGNOSIS — G4709 Other insomnia: Secondary | ICD-10-CM | POA: Diagnosis not present

## 2016-10-06 DIAGNOSIS — J302 Other seasonal allergic rhinitis: Secondary | ICD-10-CM | POA: Diagnosis not present

## 2016-10-06 DIAGNOSIS — Z6833 Body mass index (BMI) 33.0-33.9, adult: Secondary | ICD-10-CM | POA: Diagnosis not present

## 2016-10-20 DIAGNOSIS — R278 Other lack of coordination: Secondary | ICD-10-CM | POA: Diagnosis not present

## 2016-10-20 DIAGNOSIS — M6281 Muscle weakness (generalized): Secondary | ICD-10-CM | POA: Diagnosis not present

## 2016-10-20 DIAGNOSIS — N393 Stress incontinence (female) (male): Secondary | ICD-10-CM | POA: Diagnosis not present

## 2016-10-30 DIAGNOSIS — N393 Stress incontinence (female) (male): Secondary | ICD-10-CM | POA: Diagnosis not present

## 2016-10-30 DIAGNOSIS — C61 Malignant neoplasm of prostate: Secondary | ICD-10-CM | POA: Diagnosis not present

## 2016-10-30 DIAGNOSIS — N5201 Erectile dysfunction due to arterial insufficiency: Secondary | ICD-10-CM | POA: Diagnosis not present

## 2016-11-11 DIAGNOSIS — N393 Stress incontinence (female) (male): Secondary | ICD-10-CM | POA: Diagnosis not present

## 2016-11-11 DIAGNOSIS — M6281 Muscle weakness (generalized): Secondary | ICD-10-CM | POA: Diagnosis not present

## 2016-11-11 DIAGNOSIS — R278 Other lack of coordination: Secondary | ICD-10-CM | POA: Diagnosis not present

## 2016-11-25 DIAGNOSIS — Z23 Encounter for immunization: Secondary | ICD-10-CM | POA: Diagnosis not present

## 2016-11-25 DIAGNOSIS — M1711 Unilateral primary osteoarthritis, right knee: Secondary | ICD-10-CM | POA: Diagnosis not present

## 2016-11-25 DIAGNOSIS — G4709 Other insomnia: Secondary | ICD-10-CM | POA: Diagnosis not present

## 2016-11-25 DIAGNOSIS — R972 Elevated prostate specific antigen [PSA]: Secondary | ICD-10-CM | POA: Diagnosis not present

## 2016-11-25 DIAGNOSIS — E782 Mixed hyperlipidemia: Secondary | ICD-10-CM | POA: Diagnosis not present

## 2016-11-25 DIAGNOSIS — I700 Atherosclerosis of aorta: Secondary | ICD-10-CM | POA: Diagnosis not present

## 2016-11-25 DIAGNOSIS — C61 Malignant neoplasm of prostate: Secondary | ICD-10-CM | POA: Diagnosis not present

## 2016-11-25 DIAGNOSIS — R7301 Impaired fasting glucose: Secondary | ICD-10-CM | POA: Diagnosis not present

## 2016-12-04 DIAGNOSIS — G4709 Other insomnia: Secondary | ICD-10-CM | POA: Diagnosis not present

## 2016-12-04 DIAGNOSIS — Z6833 Body mass index (BMI) 33.0-33.9, adult: Secondary | ICD-10-CM | POA: Diagnosis not present

## 2016-12-04 DIAGNOSIS — J019 Acute sinusitis, unspecified: Secondary | ICD-10-CM | POA: Diagnosis not present

## 2016-12-30 DIAGNOSIS — R278 Other lack of coordination: Secondary | ICD-10-CM | POA: Diagnosis not present

## 2016-12-30 DIAGNOSIS — N393 Stress incontinence (female) (male): Secondary | ICD-10-CM | POA: Diagnosis not present

## 2016-12-30 DIAGNOSIS — M62838 Other muscle spasm: Secondary | ICD-10-CM | POA: Diagnosis not present

## 2016-12-30 DIAGNOSIS — M6281 Muscle weakness (generalized): Secondary | ICD-10-CM | POA: Diagnosis not present

## 2017-01-27 DIAGNOSIS — M62838 Other muscle spasm: Secondary | ICD-10-CM | POA: Diagnosis not present

## 2017-01-27 DIAGNOSIS — N393 Stress incontinence (female) (male): Secondary | ICD-10-CM | POA: Diagnosis not present

## 2017-01-27 DIAGNOSIS — C61 Malignant neoplasm of prostate: Secondary | ICD-10-CM | POA: Diagnosis not present

## 2017-01-27 DIAGNOSIS — M6281 Muscle weakness (generalized): Secondary | ICD-10-CM | POA: Diagnosis not present

## 2017-02-02 DIAGNOSIS — E782 Mixed hyperlipidemia: Secondary | ICD-10-CM | POA: Diagnosis not present

## 2017-02-02 DIAGNOSIS — K760 Fatty (change of) liver, not elsewhere classified: Secondary | ICD-10-CM | POA: Diagnosis not present

## 2017-02-02 DIAGNOSIS — R7301 Impaired fasting glucose: Secondary | ICD-10-CM | POA: Diagnosis not present

## 2017-02-04 DIAGNOSIS — N5201 Erectile dysfunction due to arterial insufficiency: Secondary | ICD-10-CM | POA: Diagnosis not present

## 2017-02-04 DIAGNOSIS — N393 Stress incontinence (female) (male): Secondary | ICD-10-CM | POA: Diagnosis not present

## 2017-02-04 DIAGNOSIS — C61 Malignant neoplasm of prostate: Secondary | ICD-10-CM | POA: Diagnosis not present

## 2017-02-04 IMAGING — CT CT ABD-PEL WO/W CM
3 of 11 series · 10 of 46 positions shown, 16 images · IV contrast (Omnipaque 300)
Comparison: CT the abdomen and pelvis 09/14/2009.

CLINICAL DATA: 65-year-old male with history of hematuria for the
past 2 weeks following a prostate biopsy.

EXAM:
CT ABDOMEN AND PELVIS WITHOUT AND WITH CONTRAST
TECHNIQUE: Multidetector CT imaging of the abdomen and pelvis was performed
following the standard protocol before and following the bolus
administration of intravenous contrast.
CONTRAST:  125mL TCHJUH-FRR IOPAMIDOL (TCHJUH-FRR) INJECTION 61%

[Series 3: hematuria pre contrast 5.0mm · axial · non-contrast · 0.83mm/px · 1 of 106 slices shown]
[im 16/106  soft-tissue]
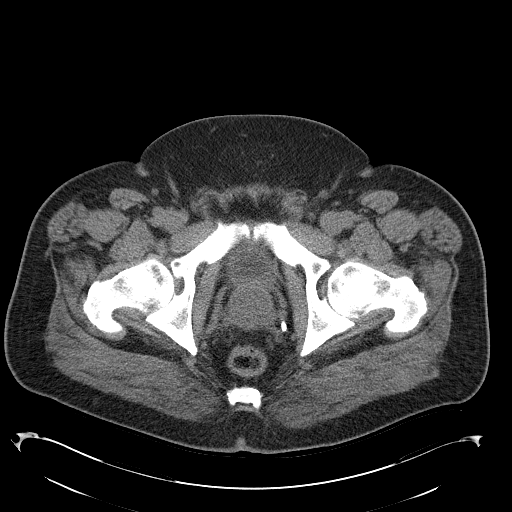

[Series 5: hematuria delay 5.0mm · axial · delayed · 0.84mm/px · z∈[-577,-187]mm · 7 of 106 slices shown, 12 images]
[im 14/106  soft-tissue]
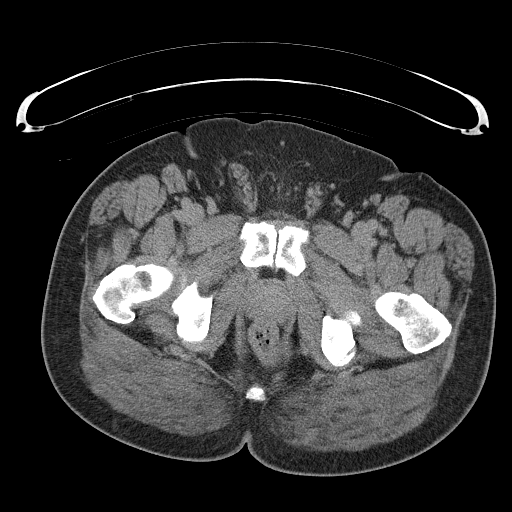
[im 14/106  bone]
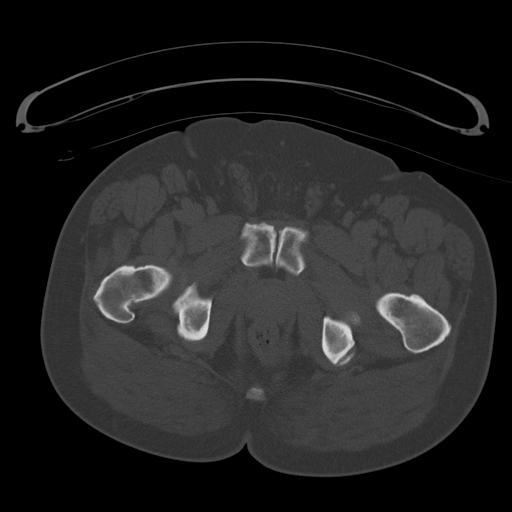
[im 27/106  soft-tissue]
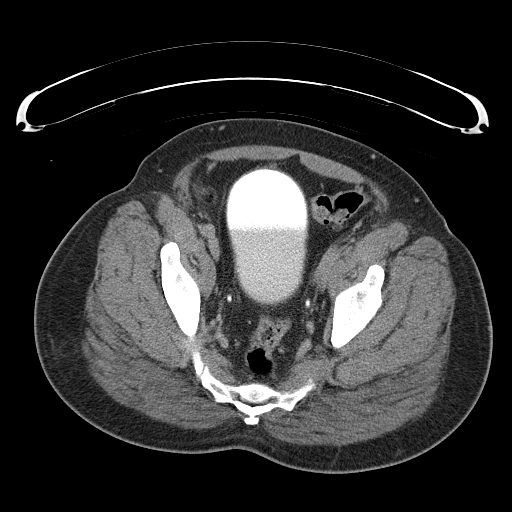
[im 40/106  soft-tissue]
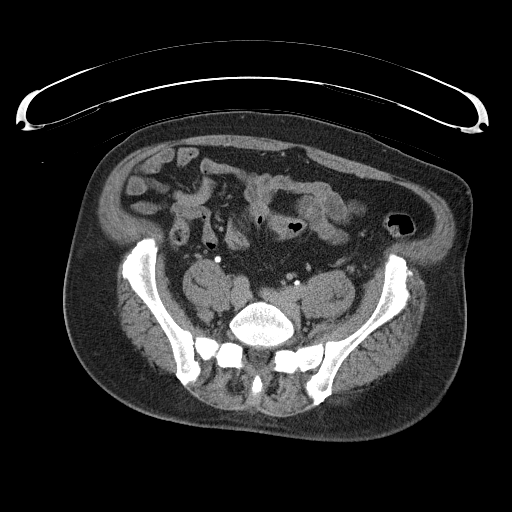
[im 53/106  soft-tissue]
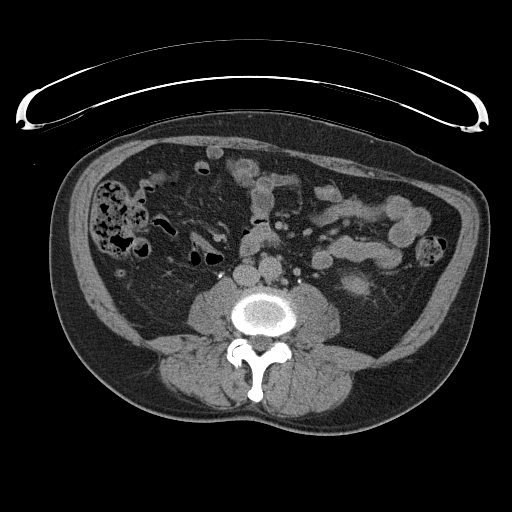
[im 53/106  lung]
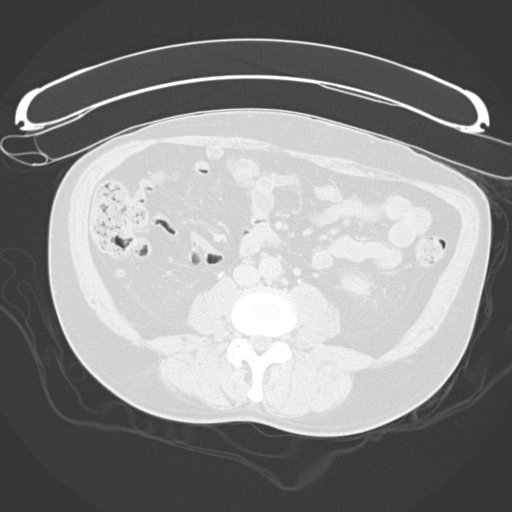
[im 66/106  soft-tissue]
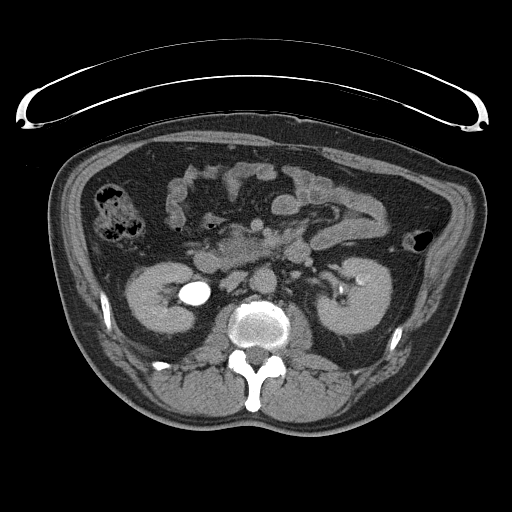
[im 66/106  lung]
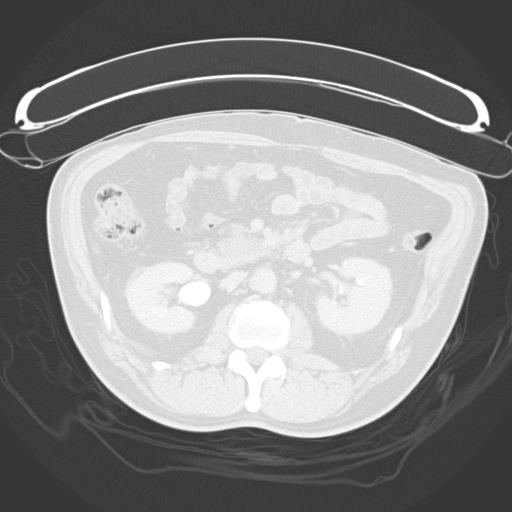
[im 79/106  soft-tissue]
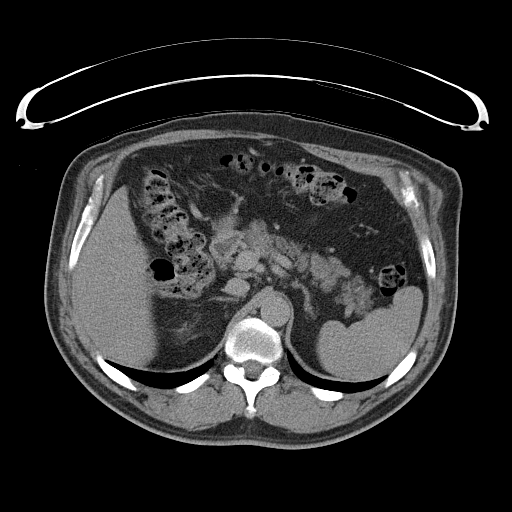
[im 79/106  lung]
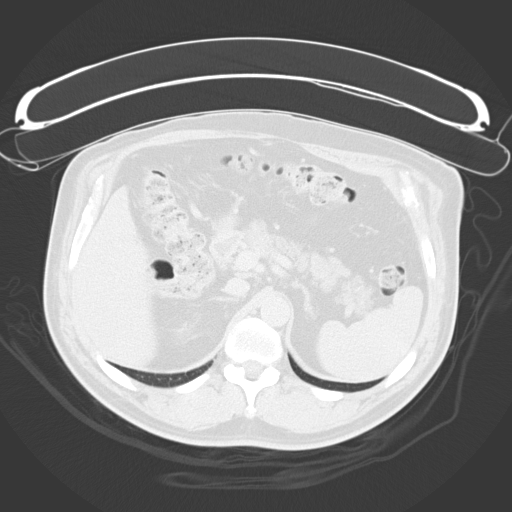
[im 92/106  soft-tissue]
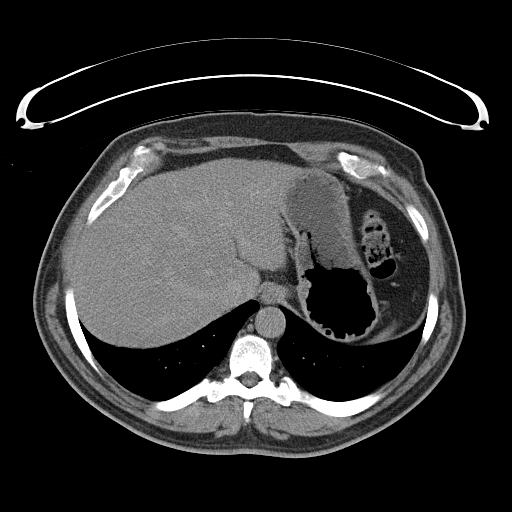
[im 92/106  lung]
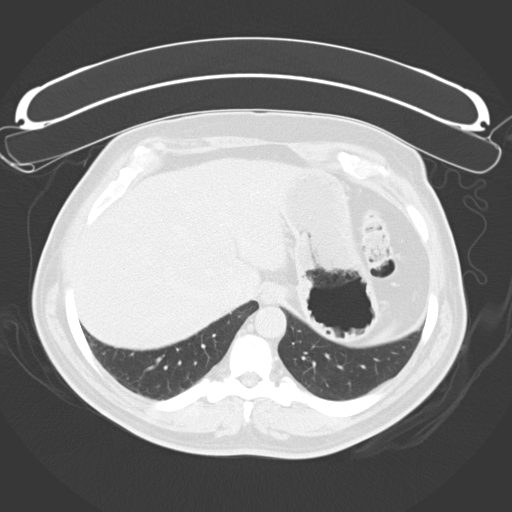

[Series 6: hematuria mpr coro pre 3.0mm · coronal · non-contrast · 0.78mm/px · 2 of 106 slices shown, 3 images]
[im 36/106  soft-tissue]
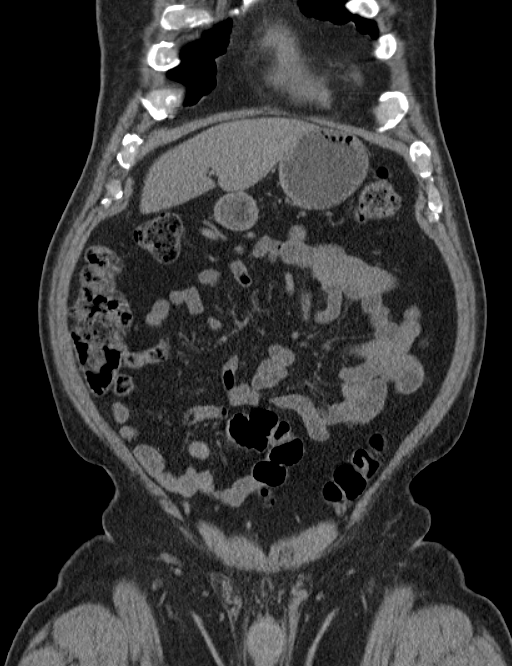
[im 36/106  bone]
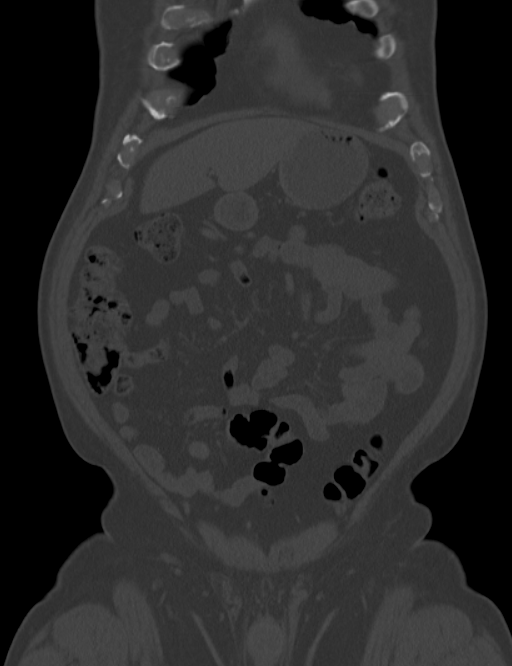
[im 71/106  soft-tissue]
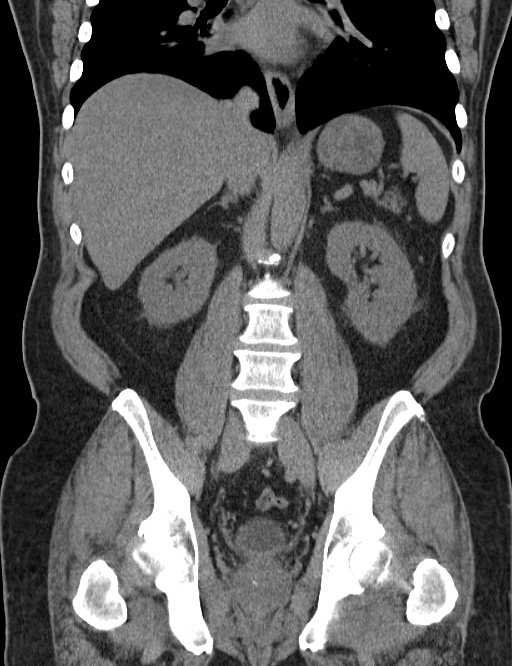

[10 of 46 positions shown; findings below may reference images not displayed]

FINDINGS: Lower chest: Atherosclerotic calcifications in the left main and
left anterior descending coronary artery.

Hepatobiliary: Mild diffuse low attenuation throughout the hepatic
parenchyma, compatible with hepatic steatosis. Sub cm
low-attenuation lesion in segment 5 of the liver adjacent to the
gallbladder fossa is too small to definitively characterize, but is
favored to represent a tiny cyst. No larger more suspicious
appearing hepatic lesions are noted. No intra or extrahepatic
biliary ductal dilatation. Gallbladder is normal in appearance.

Pancreas: No pancreatic mass. No pancreatic ductal dilatation. No
pancreatic or peripancreatic fluid or inflammatory changes.

Spleen: Unremarkable.

Adrenals/Urinary Tract: Precontrast images demonstrate no
calcifications within the collecting system of either kidney, along
the course of either ureter, or within the lumen of the urinary
bladder. Post contrast images demonstrate no focal solid renal
lesions. 13 mm exophytic simple cyst in the interpolar region of the
left kidney incidentally noted. Post contrast delayed images
demonstrate no definite filling defects within the collecting system
of either kidney, along the course of either ureter, or within the
lumen of the urinary bladder to strongly suggest the presence of a
urothelial neoplasm. Urinary bladder is normal in appearance.
Bilateral adrenal glands are normal in appearance.

Stomach/Bowel: Normal appearance of the stomach. No pathologic
dilatation of small bowel or colon. A few colonic diverticulae are
noted in the sigmoid colon, without surrounding inflammatory changes
to suggest an acute diverticulitis at this time. Normal appendix
(retrocecal in position).

Vascular/Lymphatic: Mild atherosclerosis throughout the abdominal
and pelvic vasculature, without evidence of aneurysm or dissection.
No lymphadenopathy noted in the abdomen or pelvis.

Reproductive: Prostate gland and seminal vesicles are grossly
unremarkable in appearance.

Other: No significant volume of ascites.  No pneumoperitoneum.

Musculoskeletal: There are no aggressive appearing lytic or blastic
lesions noted in the visualized portions of the skeleton.
IMPRESSION: 1. No explanation for the patient's history of hematuria identified
on today's examination.
2. Prostate gland and seminal vesicles are grossly unremarkable in
appearance on today's study.
3. Normal appendix.
4. Colonic diverticulosis without evidence of acute diverticulitis
at this time.
5. Mild hepatic steatosis.
6. Atherosclerosis, including left main and left anterior descending
coronary artery disease. Please note that although the presence of
coronary artery calcium documents the presence of coronary artery
disease, the severity of this disease and any potential stenosis
cannot be assessed on this non-gated CT examination. Assessment for
potential risk factor modification, dietary therapy or pharmacologic
therapy may be warranted, if clinically indicated.
7. Additional incidental findings, as above.

## 2017-02-06 DIAGNOSIS — C61 Malignant neoplasm of prostate: Secondary | ICD-10-CM | POA: Diagnosis not present

## 2017-02-06 DIAGNOSIS — M19041 Primary osteoarthritis, right hand: Secondary | ICD-10-CM | POA: Diagnosis not present

## 2017-02-06 DIAGNOSIS — I2510 Atherosclerotic heart disease of native coronary artery without angina pectoris: Secondary | ICD-10-CM | POA: Diagnosis not present

## 2017-02-06 DIAGNOSIS — I700 Atherosclerosis of aorta: Secondary | ICD-10-CM | POA: Diagnosis not present

## 2017-02-06 DIAGNOSIS — K760 Fatty (change of) liver, not elsewhere classified: Secondary | ICD-10-CM | POA: Diagnosis not present

## 2017-02-06 DIAGNOSIS — G4709 Other insomnia: Secondary | ICD-10-CM | POA: Diagnosis not present

## 2017-02-06 DIAGNOSIS — E782 Mixed hyperlipidemia: Secondary | ICD-10-CM | POA: Diagnosis not present

## 2017-02-06 DIAGNOSIS — J302 Other seasonal allergic rhinitis: Secondary | ICD-10-CM | POA: Diagnosis not present

## 2017-02-19 DIAGNOSIS — M62838 Other muscle spasm: Secondary | ICD-10-CM | POA: Diagnosis not present

## 2017-02-19 DIAGNOSIS — N393 Stress incontinence (female) (male): Secondary | ICD-10-CM | POA: Diagnosis not present

## 2017-02-19 DIAGNOSIS — M6281 Muscle weakness (generalized): Secondary | ICD-10-CM | POA: Diagnosis not present

## 2017-06-15 DIAGNOSIS — K1120 Sialoadenitis, unspecified: Secondary | ICD-10-CM | POA: Diagnosis not present

## 2017-06-15 DIAGNOSIS — K136 Irritative hyperplasia of oral mucosa: Secondary | ICD-10-CM | POA: Diagnosis not present

## 2017-06-29 DIAGNOSIS — K136 Irritative hyperplasia of oral mucosa: Secondary | ICD-10-CM | POA: Diagnosis not present

## 2017-06-29 DIAGNOSIS — K1120 Sialoadenitis, unspecified: Secondary | ICD-10-CM | POA: Diagnosis not present

## 2017-07-02 DIAGNOSIS — K136 Irritative hyperplasia of oral mucosa: Secondary | ICD-10-CM | POA: Diagnosis not present

## 2017-07-02 DIAGNOSIS — K1120 Sialoadenitis, unspecified: Secondary | ICD-10-CM | POA: Diagnosis not present

## 2017-07-28 DIAGNOSIS — Z0000 Encounter for general adult medical examination without abnormal findings: Secondary | ICD-10-CM | POA: Diagnosis not present

## 2017-07-28 DIAGNOSIS — E782 Mixed hyperlipidemia: Secondary | ICD-10-CM | POA: Diagnosis not present

## 2017-07-28 DIAGNOSIS — Z6829 Body mass index (BMI) 29.0-29.9, adult: Secondary | ICD-10-CM | POA: Diagnosis not present

## 2017-08-18 DIAGNOSIS — R7301 Impaired fasting glucose: Secondary | ICD-10-CM | POA: Diagnosis not present

## 2017-08-18 DIAGNOSIS — K760 Fatty (change of) liver, not elsewhere classified: Secondary | ICD-10-CM | POA: Diagnosis not present

## 2017-08-18 DIAGNOSIS — E782 Mixed hyperlipidemia: Secondary | ICD-10-CM | POA: Diagnosis not present

## 2017-08-18 DIAGNOSIS — R5383 Other fatigue: Secondary | ICD-10-CM | POA: Diagnosis not present

## 2017-08-21 DIAGNOSIS — I700 Atherosclerosis of aorta: Secondary | ICD-10-CM | POA: Diagnosis not present

## 2017-08-21 DIAGNOSIS — Z6829 Body mass index (BMI) 29.0-29.9, adult: Secondary | ICD-10-CM | POA: Diagnosis not present

## 2017-08-21 DIAGNOSIS — C61 Malignant neoplasm of prostate: Secondary | ICD-10-CM | POA: Diagnosis not present

## 2017-08-21 DIAGNOSIS — Z0000 Encounter for general adult medical examination without abnormal findings: Secondary | ICD-10-CM | POA: Diagnosis not present

## 2017-08-21 DIAGNOSIS — E782 Mixed hyperlipidemia: Secondary | ICD-10-CM | POA: Diagnosis not present

## 2017-08-21 DIAGNOSIS — M792 Neuralgia and neuritis, unspecified: Secondary | ICD-10-CM | POA: Diagnosis not present

## 2017-08-21 DIAGNOSIS — K760 Fatty (change of) liver, not elsewhere classified: Secondary | ICD-10-CM | POA: Diagnosis not present

## 2017-08-21 DIAGNOSIS — I2510 Atherosclerotic heart disease of native coronary artery without angina pectoris: Secondary | ICD-10-CM | POA: Diagnosis not present

## 2017-08-26 DIAGNOSIS — C61 Malignant neoplasm of prostate: Secondary | ICD-10-CM | POA: Diagnosis not present

## 2017-09-02 DIAGNOSIS — C61 Malignant neoplasm of prostate: Secondary | ICD-10-CM | POA: Diagnosis not present

## 2017-09-02 DIAGNOSIS — N393 Stress incontinence (female) (male): Secondary | ICD-10-CM | POA: Diagnosis not present

## 2017-09-02 DIAGNOSIS — N5201 Erectile dysfunction due to arterial insufficiency: Secondary | ICD-10-CM | POA: Diagnosis not present

## 2017-11-16 DIAGNOSIS — Z6830 Body mass index (BMI) 30.0-30.9, adult: Secondary | ICD-10-CM | POA: Diagnosis not present

## 2017-11-16 DIAGNOSIS — H10023 Other mucopurulent conjunctivitis, bilateral: Secondary | ICD-10-CM | POA: Diagnosis not present

## 2017-12-28 DIAGNOSIS — Z1283 Encounter for screening for malignant neoplasm of skin: Secondary | ICD-10-CM | POA: Diagnosis not present

## 2017-12-28 DIAGNOSIS — Z85828 Personal history of other malignant neoplasm of skin: Secondary | ICD-10-CM | POA: Diagnosis not present

## 2017-12-28 DIAGNOSIS — Z6829 Body mass index (BMI) 29.0-29.9, adult: Secondary | ICD-10-CM | POA: Diagnosis not present

## 2017-12-28 DIAGNOSIS — H8113 Benign paroxysmal vertigo, bilateral: Secondary | ICD-10-CM | POA: Diagnosis not present

## 2017-12-28 DIAGNOSIS — Z08 Encounter for follow-up examination after completed treatment for malignant neoplasm: Secondary | ICD-10-CM | POA: Diagnosis not present

## 2018-02-16 DIAGNOSIS — R7301 Impaired fasting glucose: Secondary | ICD-10-CM | POA: Diagnosis not present

## 2018-02-16 DIAGNOSIS — E559 Vitamin D deficiency, unspecified: Secondary | ICD-10-CM | POA: Diagnosis not present

## 2018-02-16 DIAGNOSIS — N281 Cyst of kidney, acquired: Secondary | ICD-10-CM | POA: Diagnosis not present

## 2018-02-16 DIAGNOSIS — I2510 Atherosclerotic heart disease of native coronary artery without angina pectoris: Secondary | ICD-10-CM | POA: Diagnosis not present

## 2018-02-16 DIAGNOSIS — D519 Vitamin B12 deficiency anemia, unspecified: Secondary | ICD-10-CM | POA: Diagnosis not present

## 2018-02-16 DIAGNOSIS — E782 Mixed hyperlipidemia: Secondary | ICD-10-CM | POA: Diagnosis not present

## 2018-02-16 DIAGNOSIS — R5383 Other fatigue: Secondary | ICD-10-CM | POA: Diagnosis not present

## 2018-02-16 DIAGNOSIS — K760 Fatty (change of) liver, not elsewhere classified: Secondary | ICD-10-CM | POA: Diagnosis not present

## 2018-02-16 DIAGNOSIS — H8113 Benign paroxysmal vertigo, bilateral: Secondary | ICD-10-CM | POA: Diagnosis not present

## 2018-02-18 DIAGNOSIS — Z6828 Body mass index (BMI) 28.0-28.9, adult: Secondary | ICD-10-CM | POA: Diagnosis not present

## 2018-02-18 DIAGNOSIS — G4709 Other insomnia: Secondary | ICD-10-CM | POA: Diagnosis not present

## 2018-02-18 DIAGNOSIS — I700 Atherosclerosis of aorta: Secondary | ICD-10-CM | POA: Diagnosis not present

## 2018-02-18 DIAGNOSIS — K760 Fatty (change of) liver, not elsewhere classified: Secondary | ICD-10-CM | POA: Diagnosis not present

## 2018-02-18 DIAGNOSIS — E559 Vitamin D deficiency, unspecified: Secondary | ICD-10-CM | POA: Diagnosis not present

## 2018-02-18 DIAGNOSIS — C61 Malignant neoplasm of prostate: Secondary | ICD-10-CM | POA: Diagnosis not present

## 2018-02-18 DIAGNOSIS — I2510 Atherosclerotic heart disease of native coronary artery without angina pectoris: Secondary | ICD-10-CM | POA: Diagnosis not present

## 2018-02-18 DIAGNOSIS — E782 Mixed hyperlipidemia: Secondary | ICD-10-CM | POA: Diagnosis not present

## 2018-02-24 DIAGNOSIS — C61 Malignant neoplasm of prostate: Secondary | ICD-10-CM | POA: Diagnosis not present

## 2018-03-02 DIAGNOSIS — D485 Neoplasm of uncertain behavior of skin: Secondary | ICD-10-CM | POA: Diagnosis not present

## 2018-03-02 DIAGNOSIS — L821 Other seborrheic keratosis: Secondary | ICD-10-CM | POA: Diagnosis not present

## 2018-03-05 DIAGNOSIS — C61 Malignant neoplasm of prostate: Secondary | ICD-10-CM | POA: Diagnosis not present

## 2018-03-05 DIAGNOSIS — N393 Stress incontinence (female) (male): Secondary | ICD-10-CM | POA: Diagnosis not present

## 2018-03-05 DIAGNOSIS — N5201 Erectile dysfunction due to arterial insufficiency: Secondary | ICD-10-CM | POA: Diagnosis not present

## 2018-04-12 DIAGNOSIS — N5201 Erectile dysfunction due to arterial insufficiency: Secondary | ICD-10-CM | POA: Diagnosis not present

## 2018-08-23 DIAGNOSIS — R7301 Impaired fasting glucose: Secondary | ICD-10-CM | POA: Diagnosis not present

## 2018-08-23 DIAGNOSIS — Z6829 Body mass index (BMI) 29.0-29.9, adult: Secondary | ICD-10-CM | POA: Diagnosis not present

## 2018-08-23 DIAGNOSIS — E782 Mixed hyperlipidemia: Secondary | ICD-10-CM | POA: Diagnosis not present

## 2018-08-23 DIAGNOSIS — K760 Fatty (change of) liver, not elsewhere classified: Secondary | ICD-10-CM | POA: Diagnosis not present

## 2018-08-23 DIAGNOSIS — D519 Vitamin B12 deficiency anemia, unspecified: Secondary | ICD-10-CM | POA: Diagnosis not present

## 2018-08-23 DIAGNOSIS — Z0000 Encounter for general adult medical examination without abnormal findings: Secondary | ICD-10-CM | POA: Diagnosis not present

## 2018-08-25 DIAGNOSIS — C61 Malignant neoplasm of prostate: Secondary | ICD-10-CM | POA: Diagnosis not present

## 2018-08-25 DIAGNOSIS — Z6829 Body mass index (BMI) 29.0-29.9, adult: Secondary | ICD-10-CM | POA: Diagnosis not present

## 2018-08-25 DIAGNOSIS — Z0001 Encounter for general adult medical examination with abnormal findings: Secondary | ICD-10-CM | POA: Diagnosis not present

## 2018-08-25 DIAGNOSIS — R0789 Other chest pain: Secondary | ICD-10-CM | POA: Diagnosis not present

## 2018-08-25 DIAGNOSIS — G4709 Other insomnia: Secondary | ICD-10-CM | POA: Diagnosis not present

## 2018-08-25 DIAGNOSIS — I700 Atherosclerosis of aorta: Secondary | ICD-10-CM | POA: Diagnosis not present

## 2018-08-25 DIAGNOSIS — E782 Mixed hyperlipidemia: Secondary | ICD-10-CM | POA: Diagnosis not present

## 2018-08-25 DIAGNOSIS — Z23 Encounter for immunization: Secondary | ICD-10-CM | POA: Diagnosis not present

## 2018-10-01 DIAGNOSIS — C61 Malignant neoplasm of prostate: Secondary | ICD-10-CM | POA: Diagnosis not present

## 2018-10-08 DIAGNOSIS — C61 Malignant neoplasm of prostate: Secondary | ICD-10-CM | POA: Diagnosis not present

## 2018-10-08 DIAGNOSIS — N393 Stress incontinence (female) (male): Secondary | ICD-10-CM | POA: Diagnosis not present

## 2018-10-08 DIAGNOSIS — N5201 Erectile dysfunction due to arterial insufficiency: Secondary | ICD-10-CM | POA: Diagnosis not present

## 2018-11-02 DIAGNOSIS — L820 Inflamed seborrheic keratosis: Secondary | ICD-10-CM | POA: Diagnosis not present

## 2018-11-02 DIAGNOSIS — L910 Hypertrophic scar: Secondary | ICD-10-CM | POA: Diagnosis not present

## 2018-11-02 DIAGNOSIS — L570 Actinic keratosis: Secondary | ICD-10-CM | POA: Diagnosis not present

## 2018-11-02 DIAGNOSIS — D485 Neoplasm of uncertain behavior of skin: Secondary | ICD-10-CM | POA: Diagnosis not present

## 2018-11-02 DIAGNOSIS — L720 Epidermal cyst: Secondary | ICD-10-CM | POA: Diagnosis not present

## 2019-01-10 DIAGNOSIS — C61 Malignant neoplasm of prostate: Secondary | ICD-10-CM | POA: Diagnosis not present

## 2019-02-18 DIAGNOSIS — C61 Malignant neoplasm of prostate: Secondary | ICD-10-CM | POA: Diagnosis not present

## 2019-02-18 DIAGNOSIS — R7301 Impaired fasting glucose: Secondary | ICD-10-CM | POA: Diagnosis not present

## 2019-02-18 DIAGNOSIS — E782 Mixed hyperlipidemia: Secondary | ICD-10-CM | POA: Diagnosis not present

## 2019-02-18 DIAGNOSIS — K5730 Diverticulosis of large intestine without perforation or abscess without bleeding: Secondary | ICD-10-CM | POA: Diagnosis not present

## 2019-02-18 DIAGNOSIS — K760 Fatty (change of) liver, not elsewhere classified: Secondary | ICD-10-CM | POA: Diagnosis not present

## 2019-02-22 DIAGNOSIS — I700 Atherosclerosis of aorta: Secondary | ICD-10-CM | POA: Diagnosis not present

## 2019-02-22 DIAGNOSIS — R0789 Other chest pain: Secondary | ICD-10-CM | POA: Diagnosis not present

## 2019-02-22 DIAGNOSIS — K5730 Diverticulosis of large intestine without perforation or abscess without bleeding: Secondary | ICD-10-CM | POA: Diagnosis not present

## 2019-02-22 DIAGNOSIS — E782 Mixed hyperlipidemia: Secondary | ICD-10-CM | POA: Diagnosis not present

## 2019-02-22 DIAGNOSIS — Z6829 Body mass index (BMI) 29.0-29.9, adult: Secondary | ICD-10-CM | POA: Diagnosis not present

## 2019-02-22 DIAGNOSIS — K760 Fatty (change of) liver, not elsewhere classified: Secondary | ICD-10-CM | POA: Diagnosis not present

## 2019-02-22 DIAGNOSIS — C61 Malignant neoplasm of prostate: Secondary | ICD-10-CM | POA: Diagnosis not present

## 2019-03-02 DIAGNOSIS — R0789 Other chest pain: Secondary | ICD-10-CM | POA: Diagnosis not present

## 2019-03-02 DIAGNOSIS — R079 Chest pain, unspecified: Secondary | ICD-10-CM | POA: Diagnosis not present

## 2019-03-02 DIAGNOSIS — I700 Atherosclerosis of aorta: Secondary | ICD-10-CM | POA: Diagnosis not present

## 2019-04-08 DIAGNOSIS — C61 Malignant neoplasm of prostate: Secondary | ICD-10-CM | POA: Diagnosis not present

## 2019-04-11 DIAGNOSIS — L0100 Impetigo, unspecified: Secondary | ICD-10-CM | POA: Diagnosis not present

## 2019-04-11 DIAGNOSIS — L720 Epidermal cyst: Secondary | ICD-10-CM | POA: Diagnosis not present

## 2019-04-11 DIAGNOSIS — D485 Neoplasm of uncertain behavior of skin: Secondary | ICD-10-CM | POA: Diagnosis not present

## 2019-04-15 DIAGNOSIS — C61 Malignant neoplasm of prostate: Secondary | ICD-10-CM | POA: Diagnosis not present

## 2019-04-15 DIAGNOSIS — N5201 Erectile dysfunction due to arterial insufficiency: Secondary | ICD-10-CM | POA: Diagnosis not present

## 2019-06-02 DIAGNOSIS — M545 Low back pain: Secondary | ICD-10-CM | POA: Diagnosis not present

## 2019-06-02 DIAGNOSIS — Z6829 Body mass index (BMI) 29.0-29.9, adult: Secondary | ICD-10-CM | POA: Diagnosis not present

## 2019-06-08 ENCOUNTER — Other Ambulatory Visit: Payer: Self-pay | Admitting: Oral Surgery

## 2019-06-08 DIAGNOSIS — D101 Benign neoplasm of tongue: Secondary | ICD-10-CM | POA: Diagnosis not present

## 2019-07-12 DIAGNOSIS — C61 Malignant neoplasm of prostate: Secondary | ICD-10-CM | POA: Diagnosis not present

## 2019-08-18 DIAGNOSIS — R5383 Other fatigue: Secondary | ICD-10-CM | POA: Diagnosis not present

## 2019-08-18 DIAGNOSIS — E782 Mixed hyperlipidemia: Secondary | ICD-10-CM | POA: Diagnosis not present

## 2019-08-18 DIAGNOSIS — K760 Fatty (change of) liver, not elsewhere classified: Secondary | ICD-10-CM | POA: Diagnosis not present

## 2019-08-18 DIAGNOSIS — R7301 Impaired fasting glucose: Secondary | ICD-10-CM | POA: Diagnosis not present

## 2019-08-18 DIAGNOSIS — E559 Vitamin D deficiency, unspecified: Secondary | ICD-10-CM | POA: Diagnosis not present

## 2019-08-25 DIAGNOSIS — I700 Atherosclerosis of aorta: Secondary | ICD-10-CM | POA: Diagnosis not present

## 2019-08-25 DIAGNOSIS — E782 Mixed hyperlipidemia: Secondary | ICD-10-CM | POA: Diagnosis not present

## 2019-08-25 DIAGNOSIS — M792 Neuralgia and neuritis, unspecified: Secondary | ICD-10-CM | POA: Diagnosis not present

## 2019-08-25 DIAGNOSIS — Z0001 Encounter for general adult medical examination with abnormal findings: Secondary | ICD-10-CM | POA: Diagnosis not present

## 2019-08-25 DIAGNOSIS — K5730 Diverticulosis of large intestine without perforation or abscess without bleeding: Secondary | ICD-10-CM | POA: Diagnosis not present

## 2019-08-25 DIAGNOSIS — K760 Fatty (change of) liver, not elsewhere classified: Secondary | ICD-10-CM | POA: Diagnosis not present

## 2019-08-25 DIAGNOSIS — Z6830 Body mass index (BMI) 30.0-30.9, adult: Secondary | ICD-10-CM | POA: Diagnosis not present

## 2019-08-25 DIAGNOSIS — C61 Malignant neoplasm of prostate: Secondary | ICD-10-CM | POA: Diagnosis not present

## 2019-10-13 DIAGNOSIS — C61 Malignant neoplasm of prostate: Secondary | ICD-10-CM | POA: Diagnosis not present

## 2019-10-13 DIAGNOSIS — Z6829 Body mass index (BMI) 29.0-29.9, adult: Secondary | ICD-10-CM | POA: Diagnosis not present

## 2019-10-13 DIAGNOSIS — R5383 Other fatigue: Secondary | ICD-10-CM | POA: Diagnosis not present

## 2019-10-13 DIAGNOSIS — M545 Low back pain: Secondary | ICD-10-CM | POA: Diagnosis not present

## 2019-10-16 DIAGNOSIS — Z8616 Personal history of COVID-19: Secondary | ICD-10-CM

## 2019-10-16 HISTORY — DX: Personal history of COVID-19: Z86.16

## 2019-11-03 DIAGNOSIS — C61 Malignant neoplasm of prostate: Secondary | ICD-10-CM | POA: Diagnosis not present

## 2019-11-08 DIAGNOSIS — C61 Malignant neoplasm of prostate: Secondary | ICD-10-CM | POA: Diagnosis not present

## 2019-11-28 NOTE — Progress Notes (Signed)
GU Location of Tumor / Histology: biochemical recurrent prostate cancer  If Prostate Cancer, Gleason Score is (4 + 5) and PSA is (6.99) pre treatment. Negative surgical margins.  ROARY CURB had a robotic prostatectomy August 2017. His rising PSA is indicative of bio chemcial reoccurrence.      Past/Anticipated interventions by urology, if any: prostate biopsy, robotic prostatectomy, active surveillance, referral to [facility] for consideration of salvage radiotherapy  Past/Anticipated interventions by medical oncology, if any: no  Weight changes, if any: Down 23 lb since first of year on KETO diet.  Bowel/Bladder complaints, if any: IPSS 9. SHIM 17 only with Trimix otherwise SHIM 1. Denies dysuria or hematuria.  Leakage s/p prostatectomy. Denies any bowel complaints.  Nausea/Vomiting, if any: no  Pain issues, if any:  denies  SAFETY ISSUES:  Prior radiation? denies  Pacemaker/ICD? denies  Possible current pregnancy? No, male patient.  Is the patient on methotrexate? no  Current Complaints / other details:  68 year old white male. Married. Non smoker. Has four children but one was killed by a drunk driver.

## 2019-11-29 ENCOUNTER — Encounter: Payer: Self-pay | Admitting: Radiation Oncology

## 2019-11-29 ENCOUNTER — Ambulatory Visit
Admission: RE | Admit: 2019-11-29 | Discharge: 2019-11-29 | Disposition: A | Discharge status: Home / Self Care | Payer: PPO | Source: Ambulatory Visit | Attending: Radiation Oncology | Admitting: Radiation Oncology

## 2019-11-29 ENCOUNTER — Other Ambulatory Visit: Payer: Self-pay

## 2019-11-29 VITALS — Ht 67.5 in | Wt 191.0 lb

## 2019-11-29 DIAGNOSIS — C61 Malignant neoplasm of prostate: Secondary | ICD-10-CM

## 2019-11-29 DIAGNOSIS — R9721 Rising PSA following treatment for malignant neoplasm of prostate: Secondary | ICD-10-CM | POA: Diagnosis not present

## 2019-11-29 DIAGNOSIS — J309 Allergic rhinitis, unspecified: Secondary | ICD-10-CM | POA: Insufficient documentation

## 2019-11-29 DIAGNOSIS — E785 Hyperlipidemia, unspecified: Secondary | ICD-10-CM | POA: Insufficient documentation

## 2019-11-29 DIAGNOSIS — Z9079 Acquired absence of other genital organ(s): Secondary | ICD-10-CM | POA: Diagnosis not present

## 2019-11-29 HISTORY — DX: Malignant neoplasm of prostate: C61

## 2019-11-29 NOTE — Progress Notes (Signed)
See progress note under physician encounter. 

## 2019-11-29 NOTE — Progress Notes (Signed)
Radiation Oncology         [phone] ________________________________  Initial outpatient Consultation - Conducted via Telephone due to current COVID-19 concerns for limiting Gregory Middleton exposure  Name: Gregory Middleton MRN: [id number]  Date: 11/29/2019  DOB: 08/27/1951  WI:[id number], Gregory Evener, MD  Gregory Bring, MD   REFERRING PHYSICIAN: [name], MD  DIAGNOSIS: 68 y.o. gentleman with rising detectable PSA on 0.17 s/p prostatectomy in 07/2016 for Stage pT3a, pN0, Gleason 4+5 disease with focal ECE and PSA of 6.99 at diagnosis.    ICD-10-CM   1. Malignant neoplasm of prostate (Gregory Middleton)  C61   2. Prostate cancer (Gregory Middleton)  C61   3. Biochemically recurrent malignant neoplasm of prostate  R97.21     HISTORY OF PRESENT ILLNESS: Gregory Middleton is a 68 y.o. male with a diagnosis of prostate cancer. He was initially diagnosed with cT1c Gleason 4+5 prostate cancer in 03/2016. He met with Dr. Tammi Middleton in [place] around that time to discuss radiation treatment options and ultimately elected to proceed with robotic prostatectomy. His UNS RALP with BPLND was performed by Dr. Alinda Middleton on 07/28/2016.  Final surgical pathology showed pT3aN0, Gleason 4+5 prostatic adenocarcinoma with focal extracapsular extension but margins uninvolved and seminal vesicles free of tumor and 7/7 nodes were benign.  Unfortunately, his PSA remained detectable post-prostatectomy with a very slow, gradual rise since that time as outlined below: 01/2017 - 0.055 08/2017 - 0.069 02/2018 - 0.064 09/2018 - 0.11 12/2018 - 0.089 03/2019 - 0.101 06/2019 - 0.114 10/2019 - 0.17  [name] reviewed the surgical pathology results and PSA results with his urologist and he has kindly been referred today for discussion of potential salvage radiation treatment options.  PREVIOUS RADIATION THERAPY: No  PAST MEDICAL HISTORY:  Past Medical History:  Diagnosis Date  . ED (erectile dysfunction)   . Glucose intolerance (impaired glucose  tolerance)   . Hyperlipidemia   . Left rotator cuff tear   . Migraine   . Near syncope   . Overweight   . Pre-diabetes   . Prostate cancer (Gregory Middleton)   . Seasonal allergies   . Syncopal episodes    heart related issues ruled out after many tests-other than heart catheterization -not done.      PAST SURGICAL HISTORY: Past Surgical History:  Procedure Laterality Date  . ACHILLES TENDON REPAIR     x2 '07  . COLONOSCOPY    . LYMPHADENECTOMY Bilateral 07/28/2016   Procedure: LYMPHADENECTOMY;  Surgeon: Gregory Bring, MD;  Location: [facility];  Service: Urology;  Laterality: Bilateral;  . ROBOT ASSISTED LAPAROSCOPIC RADICAL PROSTATECTOMY N/A 07/28/2016   Procedure: XI ROBOTIC ASSISTED LAPAROSCOPIC RADICAL PROSTATECTOMY;  Surgeon: Gregory Bring, MD;  Location: [facility];  Service: Urology;  Laterality: N/A;  . SPINE SURGERY     neck fusion '11"fell from tree"  . TONSILLECTOMY    . VASECTOMY      FAMILY HISTORY:  Family History  Problem Relation Age of Onset  . Stroke Mother   . Cancer Neg Hx     SOCIAL HISTORY:  Social History   Socioeconomic History  . Marital status: Married    Spouse name: Not on file  . Number of children: 4  . Years of education: Not on file  . Highest education level: Not on file  Occupational History  . 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 Smoking status: Never Smoker  . Smokeless tobacco: Never Used  Substance and Sexual Activity  . Alcohol use: Yes    Comment: Ocassional wine and beer  . Drug use: No  . Sexual activity: Yes    Comment: with aid of trimix  Other Topics Concern  . Not on file  Social History Narrative   3 living children 1 child passed in 2005 (killed by a drunk driver)   Social Determinants of Health   Financial Resource Strain:   . Difficulty of Paying Living Expenses: Not on file  Food Insecurity:   . Worried About Charity fundraiser in the Last Year: Not on file  . Ran  Out of Food in the Last Year: Not on file  Transportation Needs:   . Lack of Transportation (Medical): Not on file  . Lack of Transportation (Non-Medical): Not on file  Physical Activity:   . Days of Exercise per Week: Not on file  . Minutes of Exercise per Session: Not on file  Stress:   . Feeling of Stress : Not on file  Social Connections:   . Frequency of Communication with Friends and Family: Not on file  . Frequency of Social Gatherings with Friends and Family: Not on file  . Attends Religious Services: Not on file  . Active Member of Clubs or Organizations: Not on file  . Attends Archivist Meetings: Not on file  . Marital Status: Not on file  Intimate Partner Violence:   . Fear of Current or Ex-Partner: Not on file  . Emotionally Abused: Not on file  . Physically Abused: Not on file  . Sexually Abused: Not on file    ALLERGIES: Ambien [zolpidem tartrate]  MEDICATIONS:  Current Outpatient Medications  Medication Sig Dispense Refill  . aspirin EC 81 MG tablet Take 81 mg by mouth daily.    . cholecalciferol (VITAMIN D3) 25 MCG (1000 UT) tablet Take 1,000 Units by mouth daily.    . diphenhydrAMINE (BENADRYL) 25 MG tablet Take 25 mg by mouth every 6 (six) hours as needed (takes 2 , 50 mg total).    . fexofenadine (ALLEGRA) 60 MG tablet Take 120 mg by mouth daily.    . fluticasone (FLONASE) 50 MCG/ACT nasal spray Place into the nose.    Marland Kitchen guaiFENesin (MUCINEX) 600 MG 12 hr tablet Take 600 mg by mouth daily.     Marland Kitchen ibuprofen (ADVIL) 400 MG tablet Take by mouth.    . loratadine (CLARITIN) 10 MG tablet Take 10 mg by mouth daily.    . NON FORMULARY Trimix injections to manage ED    . Omega-3 Fatty Acids (FISH OIL) 1000 MG CAPS Take by mouth.    . simvastatin (ZOCOR) 40 MG tablet Take 40 mg by mouth every morning.   0  . traZODone (DESYREL) 50 MG tablet Take 50 mg by mouth at bedtime.     No current facility-administered medications for this encounter.    REVIEW OF  SYSTEMS:  On review of systems, [name] reports that he is doing well overall. He denies any chest pain, shortness of breath, cough, fevers, chills, night sweats, unintended weight changes. He denies any bowel disturbances, and denies abdominal pain, nausea or vomiting. He denies any new musculoskeletal or joint aches or pains. His IPSS was 9, indicating moderate urinary symptoms. He reports leakage s/p prostatectomy but does not require use of pads or protective undergarments at this point. His SHIM was 17 with Trimix, indicating he has moderately controlled erectile dysfunction. A complete review of systems is  obtained and is otherwise negative.  PHYSICAL EXAM:  Wt Readings from Last 3 Encounters:  11/29/19 191 lb (86.6 kg)  07/28/16 214 lb 15.2 oz (97.5 kg)  07/17/16 215 lb (97.5 kg)   Temp Readings from Last 3 Encounters:  07/29/16 98.2 F (36.8 C) (Oral)  07/17/16 98.2 F (36.8 C)  04/09/16 98.2 F (36.8 C) (Oral)   BP Readings from Last 3 Encounters:  07/29/16 103/64  07/17/16 132/73  04/09/16 (!) 142/72   Pulse Readings from Last 3 Encounters:  07/29/16 79  07/17/16 70  04/09/16 68   Pain Assessment Pain Score: 0-No pain/10  Physical exam not performed in light of telephone consult format.   KPS = 90  100 - Normal; no complaints; no evidence of disease. 90   - Able to carry on normal activity; minor signs or symptoms of disease. 80   - Normal activity with effort; some signs or symptoms of disease. 50   - Cares for self; unable to carry on normal activity or to do active work. 60   - Requires occasional assistance, but is able to care for most of his personal needs. 50   - Requires considerable assistance and frequent medical care. 73   - Disabled; requires special care and assistance. 29   - Severely disabled; hospital admission is indicated although death not imminent. 2   - Very sick; hospital admission necessary; active supportive treatment necessary. 10   -  Moribund; fatal processes progressing rapidly. 0     - Dead  Karnofsky DA, Abelmann [place], Craver LS and Burchenal [facility] [phone]) The use of the nitrogen mustards in the palliative treatment of carcinoma: with particular reference to bronchogenic carcinoma Cancer 1 634-56  LABORATORY DATA:  Lab Results  Component Value Date   WBC 6.6 07/17/2016   HGB 14.0 07/29/2016   HCT 39.2 07/29/2016   MCV 88.4 07/17/2016   PLT 211 07/17/2016   Lab Results  Component Value Date   NA 140 07/17/2016   K 4.1 07/17/2016   CL 109 07/17/2016   CO2 25 07/17/2016   Lab Results  Component Value Date   ALT 32 09/14/2009   AST 23 09/14/2009   ALKPHOS 75 09/14/2009   BILITOT 0.8 09/14/2009     RADIOGRAPHY: No results found.    IMPRESSION/PLAN: This visit was conducted via Telephone to spare [name] unnecessary potential exposure in the healthcare setting during the current COVID-19 pandemic. 1. 68 y.o. gentleman with rising detectable PSA at 0.17 s/p prostatectomy in 07/2016 for Stage pT3a, pN0, Gleason 4+5 disease with focal ECE and PSA of 6.99 at diagnosis. Today, we reviewed the findings and workup thus far.  We discussed the natural history of prostate cancer.  We reviewed the the implications of positive margins, extracapsular extension, and seminal vesicle involvement on the risk of prostate cancer recurrence, particularly in light of a detectable, rising PSA.  We discussed radiation treatment directed to the prostatic fossa with regard to the logistics and delivery of external beam radiation treatment. The recommendation is to proceed with a 7.5 week course of daily salvage radiotherapy to the prostate fossa. We discussed and outlined the risks, benefits, short and long-term effects associated with radiotherapy in this setting. We also detailed the role of ADT in the treatment of biochemically recurrent high risk prostate cancer and outlined the associated side effects that could be expected with this  therapy. We reviewed data from the [facility] trial regarding use of ADT in combination with  salvage XRT to the prostate fossa and pelvic lymph nodes in the setting of biochemical recurrence.  This study shows that extending radiation therapy to the pelvic lymph nodes combined with adding short-term hormone therapy to standard treatment of the prostate surgical bed can extend the amount of time before disease progression but does not appear to demonstrate an overall survival benefit.  Therefore, it is felt that the toxicities associated with ADT and the negative impact it has on quality of life for Gregory Middleton's with minimal adverse pathology findings and low detectable PSA several years out from surgery, may outweigh the overall benefits of the treatment. Following a lengthy discussion of this topic, [name] prefers to avoid ADT at this time and only consider the use of ADT should his PSA continue to rise despite salvage radiation alone. He was encouraged to ask questions that were answered to his stated satisfaction.  At the end of the conversation [name] would like to continue in [place] for the time being and proceed with salvage radiation without ADT if/when the PSA reaches 0.2 - 0.3.  He is scheduled for a repeat PSA with Dr. Alinda Middleton on 02/07/2020. Since he lives in [place], we discussed the option to receive his daily treatments at [facility] in [place] under the care of Dr. Francesca Jewett.  He will consider this option and let us know if he would like to meet with Dr. Corine Shelter to explore this option further.  We will share our discussion with Dr. Alinda Middleton and look forward to following along in the care of this very nice gentleman.  Given current concerns for Gregory Middleton exposure during the COVID-19 pandemic, this encounter was conducted via telephone. [name] was notified in advance and was offered a MyChart meeting to allow for face to face communication but unfortunately reported that he did not have the  appropriate resources/technology to support such a visit and instead preferred to proceed with telephone consult. [name] has given verbal consent for this type of encounter. The time spent during this encounter was 60 minutes. The attendants for this meeting include Tyler Pita MD, Ashlyn Bruning PA-C, [place], Gregory Middleton, Gregory Middleton. During the encounter, Tyler Pita MD, Ashlyn Bruning PA-C, and scribe, Wilburn Mylar were located at [place].  Gregory Middleton, Gregory Middleton was located at home.    [name], PA-C    Tyler Pita, MD  [facility] Direct Dial: [phone]  Fax: [phone] [email or web address]  Skype  [email or web address]  This document serves as a record of services personally performed by Tyler Pita, MD and Freeman Caldron, PA-C. It was created on their behalf by Wilburn Mylar, a trained medical scribe. The creation of this record is based on the scribe's personal observations and the provider's statements to them. This document has been checked and approved by the attending provider.

## 2019-11-30 DIAGNOSIS — C61 Malignant neoplasm of prostate: Secondary | ICD-10-CM | POA: Insufficient documentation

## 2019-11-30 DIAGNOSIS — R9721 Rising PSA following treatment for malignant neoplasm of prostate: Secondary | ICD-10-CM | POA: Insufficient documentation

## 2020-02-01 DIAGNOSIS — Z20828 Contact with and (suspected) exposure to other viral communicable diseases: Secondary | ICD-10-CM | POA: Diagnosis not present

## 2020-02-07 DIAGNOSIS — C61 Malignant neoplasm of prostate: Secondary | ICD-10-CM | POA: Diagnosis not present

## 2020-02-16 DIAGNOSIS — R7301 Impaired fasting glucose: Secondary | ICD-10-CM | POA: Diagnosis not present

## 2020-02-16 DIAGNOSIS — R5383 Other fatigue: Secondary | ICD-10-CM | POA: Diagnosis not present

## 2020-02-16 DIAGNOSIS — K760 Fatty (change of) liver, not elsewhere classified: Secondary | ICD-10-CM | POA: Diagnosis not present

## 2020-02-16 DIAGNOSIS — E782 Mixed hyperlipidemia: Secondary | ICD-10-CM | POA: Diagnosis not present

## 2020-02-23 DIAGNOSIS — M792 Neuralgia and neuritis, unspecified: Secondary | ICD-10-CM | POA: Diagnosis not present

## 2020-02-23 DIAGNOSIS — E782 Mixed hyperlipidemia: Secondary | ICD-10-CM | POA: Diagnosis not present

## 2020-02-23 DIAGNOSIS — M720 Palmar fascial fibromatosis [Dupuytren]: Secondary | ICD-10-CM | POA: Diagnosis not present

## 2020-02-23 DIAGNOSIS — K5730 Diverticulosis of large intestine without perforation or abscess without bleeding: Secondary | ICD-10-CM | POA: Diagnosis not present

## 2020-02-23 DIAGNOSIS — C61 Malignant neoplasm of prostate: Secondary | ICD-10-CM | POA: Diagnosis not present

## 2020-02-23 DIAGNOSIS — K760 Fatty (change of) liver, not elsewhere classified: Secondary | ICD-10-CM | POA: Diagnosis not present

## 2020-02-23 DIAGNOSIS — I700 Atherosclerosis of aorta: Secondary | ICD-10-CM | POA: Diagnosis not present

## 2020-02-23 DIAGNOSIS — Z6829 Body mass index (BMI) 29.0-29.9, adult: Secondary | ICD-10-CM | POA: Diagnosis not present

## 2020-04-27 DIAGNOSIS — C61 Malignant neoplasm of prostate: Secondary | ICD-10-CM | POA: Diagnosis not present

## 2020-05-04 DIAGNOSIS — C61 Malignant neoplasm of prostate: Secondary | ICD-10-CM | POA: Diagnosis not present

## 2020-05-04 DIAGNOSIS — N5201 Erectile dysfunction due to arterial insufficiency: Secondary | ICD-10-CM | POA: Diagnosis not present

## 2020-07-17 DIAGNOSIS — H47093 Other disorders of optic nerve, not elsewhere classified, bilateral: Secondary | ICD-10-CM | POA: Diagnosis not present

## 2020-08-09 DIAGNOSIS — H532 Diplopia: Secondary | ICD-10-CM | POA: Diagnosis not present

## 2020-08-23 DIAGNOSIS — R5383 Other fatigue: Secondary | ICD-10-CM | POA: Diagnosis not present

## 2020-08-23 DIAGNOSIS — E782 Mixed hyperlipidemia: Secondary | ICD-10-CM | POA: Diagnosis not present

## 2020-08-23 DIAGNOSIS — K760 Fatty (change of) liver, not elsewhere classified: Secondary | ICD-10-CM | POA: Diagnosis not present

## 2020-08-23 DIAGNOSIS — D519 Vitamin B12 deficiency anemia, unspecified: Secondary | ICD-10-CM | POA: Diagnosis not present

## 2020-08-23 DIAGNOSIS — E559 Vitamin D deficiency, unspecified: Secondary | ICD-10-CM | POA: Diagnosis not present

## 2020-08-23 DIAGNOSIS — R7301 Impaired fasting glucose: Secondary | ICD-10-CM | POA: Diagnosis not present

## 2020-08-30 DIAGNOSIS — K5730 Diverticulosis of large intestine without perforation or abscess without bleeding: Secondary | ICD-10-CM | POA: Diagnosis not present

## 2020-08-30 DIAGNOSIS — Z6830 Body mass index (BMI) 30.0-30.9, adult: Secondary | ICD-10-CM | POA: Diagnosis not present

## 2020-08-30 DIAGNOSIS — R7303 Prediabetes: Secondary | ICD-10-CM | POA: Diagnosis not present

## 2020-08-30 DIAGNOSIS — E782 Mixed hyperlipidemia: Secondary | ICD-10-CM | POA: Diagnosis not present

## 2020-08-30 DIAGNOSIS — Z0001 Encounter for general adult medical examination with abnormal findings: Secondary | ICD-10-CM | POA: Diagnosis not present

## 2020-08-30 DIAGNOSIS — C61 Malignant neoplasm of prostate: Secondary | ICD-10-CM | POA: Diagnosis not present

## 2020-08-30 DIAGNOSIS — I700 Atherosclerosis of aorta: Secondary | ICD-10-CM | POA: Diagnosis not present

## 2020-08-30 DIAGNOSIS — Z23 Encounter for immunization: Secondary | ICD-10-CM | POA: Diagnosis not present

## 2020-10-16 DIAGNOSIS — H35411 Lattice degeneration of retina, right eye: Secondary | ICD-10-CM | POA: Diagnosis not present

## 2020-10-16 DIAGNOSIS — H47093 Other disorders of optic nerve, not elsewhere classified, bilateral: Secondary | ICD-10-CM | POA: Diagnosis not present

## 2020-10-16 DIAGNOSIS — H532 Diplopia: Secondary | ICD-10-CM | POA: Diagnosis not present

## 2020-10-24 DIAGNOSIS — C61 Malignant neoplasm of prostate: Secondary | ICD-10-CM | POA: Diagnosis not present

## 2020-10-31 DIAGNOSIS — C61 Malignant neoplasm of prostate: Secondary | ICD-10-CM | POA: Diagnosis not present

## 2020-10-31 DIAGNOSIS — N5201 Erectile dysfunction due to arterial insufficiency: Secondary | ICD-10-CM | POA: Diagnosis not present

## 2020-11-13 ENCOUNTER — Telehealth: Payer: Self-pay | Admitting: Radiation Oncology

## 2020-11-13 NOTE — Telephone Encounter (Signed)
Patient LVM with Sam stating he was confused about the location of his FUN appointment. Returned patient's call, no answer, LVM for a return call.

## 2020-11-26 ENCOUNTER — Encounter: Payer: Self-pay | Admitting: Medical Oncology

## 2020-11-26 NOTE — Progress Notes (Signed)
Left message asking patient if he is considering radiation here in [place] or [facility]. At his last consult he had mentioned [facility] would be closer. If he prefers to go there we will make a referral for a consult. I told him we are happy to treat him here but would like to make it convenient for him. I asked for a return call.

## 2020-11-29 ENCOUNTER — Telehealth: Payer: Self-pay | Admitting: Radiation Oncology

## 2020-11-29 ENCOUNTER — Encounter: Payer: Self-pay | Admitting: Medical Oncology

## 2020-11-29 NOTE — Telephone Encounter (Signed)
Received voicemail message from patient. Patient explains in his voicemail that he received a voicemail from Cira Rue questioning if he will be pursuing radiation in [place] with Dr. Tammi Klippel or closer to his home. Patient verbalized his intentions are to receive care from Dr. Tammi Klippel in [place] and present for Friday's appointment. Patient requested this RN relay this information to [place].

## 2020-11-30 ENCOUNTER — Other Ambulatory Visit: Payer: Self-pay

## 2020-11-30 ENCOUNTER — Ambulatory Visit
Admission: RE | Admit: 2020-11-30 | Discharge: 2020-11-30 | Disposition: A | Discharge status: Home / Self Care | Payer: PPO | Source: Ambulatory Visit | Attending: Urology | Admitting: Urology

## 2020-11-30 ENCOUNTER — Ambulatory Visit
Admission: RE | Admit: 2020-11-30 | Discharge: 2020-11-30 | Disposition: A | Discharge status: Home / Self Care | Payer: PPO | Source: Ambulatory Visit | Attending: Radiation Oncology | Admitting: Radiation Oncology

## 2020-11-30 ENCOUNTER — Encounter: Payer: Self-pay | Admitting: Urology

## 2020-11-30 VITALS — BP 131/69 | HR 66 | Temp 97.7°F | Resp 18 | Ht 67.5 in | Wt 199.6 lb

## 2020-11-30 DIAGNOSIS — R972 Elevated prostate specific antigen [PSA]: Secondary | ICD-10-CM | POA: Diagnosis not present

## 2020-11-30 DIAGNOSIS — E785 Hyperlipidemia, unspecified: Secondary | ICD-10-CM | POA: Diagnosis not present

## 2020-11-30 DIAGNOSIS — N529 Male erectile dysfunction, unspecified: Secondary | ICD-10-CM | POA: Insufficient documentation

## 2020-11-30 DIAGNOSIS — Z79899 Other long term (current) drug therapy: Secondary | ICD-10-CM | POA: Insufficient documentation

## 2020-11-30 DIAGNOSIS — R9721 Rising PSA following treatment for malignant neoplasm of prostate: Secondary | ICD-10-CM

## 2020-11-30 DIAGNOSIS — Z7982 Long term (current) use of aspirin: Secondary | ICD-10-CM | POA: Diagnosis not present

## 2020-11-30 DIAGNOSIS — C61 Malignant neoplasm of prostate: Secondary | ICD-10-CM

## 2020-11-30 NOTE — Progress Notes (Signed)
  Radiation Oncology         [phone]) [phone] ________________________________  Name: Gregory Middleton MRN: [id number]  Date: 11/30/2020  DOB: 1951-10-13  SIMULATION AND TREATMENT PLANNING NOTE    ICD-10-CM   1. Prostate cancer ([place])  C61   2. Biochemically recurrent malignant neoplasm of prostate [facility])  C61    R97.21     DIAGNOSIS:  68 y.o. gentleman with rising detectable PSA at 0.2 s/p prostatectomy in 07/2016 for Stage pT3a, pN0, Gleason 4+5 disease with focal ECE and PSA of 6.99 at diagnosis.  NARRATIVE:  The patient was brought to the [facility].  Identity was confirmed.  All relevant records and images related to the planned course of therapy were reviewed.  The patient freely provided informed written consent to proceed with treatment after reviewing the details related to the planned course of therapy. The consent form was witnessed and verified by the simulation staff.  Then, the patient was set-up in a stable reproducible supine position for radiation therapy.  A vacuum lock pillow device was custom fabricated to position his legs in a reproducible immobilized position.  Then, I performed a urethrogram under sterile conditions to identify the prostatic bed.  CT images were obtained.  Surface markings were placed.  The CT images were loaded into the planning software.  Then the prostate bed target, pelvic lymph node target and avoidance structures including the rectum, bladder, bowel and hips were contoured.  Treatment planning then occurred.  The radiation prescription was entered and confirmed.  A total of one complex treatment devices were fabricated. I have requested : Intensity Modulated Radiotherapy (IMRT) is medically necessary for this case for the following reason:  Rectal sparing.Marland Kitchen  PLAN:  The patient will receive 45 Gy in 25 fractions of 1.8 Gy, followed by a boost to the prostate bed to a total dose of 68.4 Gy with 13 additional fractions of 1.8  Gy.   ________________________________  Sheral Apley Tammi Klippel, M.D.  This document serves as a record of services personally performed by Tyler Pita, MD. It was created on his behalf by Wilburn Mylar, a trained medical scribe. The creation of this record is based on the scribe's personal observations and the provider's statements to them. This document has been checked and approved by the attending provider.

## 2020-11-30 NOTE — Progress Notes (Signed)
Radiation Oncology         (336) [phone] ________________________________  Outpatient Re-Consultation  Name: Gregory Middleton MRN: [id number]  Date: 11/30/2020  DOB: 03/24/1951  ZY:[id number], Virgina Evener, MD  [name], MD   REFERRING PHYSICIAN: [name], MD  DIAGNOSIS: 69 y.o. gentleman with rising detectable PSA of 0.2 s/p prostatectomy in 07/2016 for Stage pT3a, pN0, Gleason 4+5 disease with focal ECE and PSA of 6.99 at diagnosis.    ICD-10-CM   1. Malignant neoplasm of prostate ([place])  C61   2. Biochemically recurrent malignant neoplasm of prostate [facility])  C61    R97.21     HISTORY OF PRESENT ILLNESS: Gregory Middleton is a 69 y.o. male with a diagnosis of prostate cancer. He was initially diagnosed with cT1c Gleason 4+5 prostate cancer in 03/2016. He met with Dr. Tammi Klippel in [place] around that time to discuss radiation treatment options and ultimately elected to proceed with robotic prostatectomy. His UNS RALP with BPLND was performed by Dr. Alinda Money on 07/28/2016.  Final surgical pathology showed pT3aN0, Gleason 4+5 prostatic adenocarcinoma with focal extracapsular extension but margins uninvolved and seminal vesicles free of tumor and 7/7 nodes benign.  Unfortunately, his PSA remained detectable postoperatively with a very slow, gradual rise since that time. We met with the patient in 11/2019 after his PSA had risen to 0.17. At that time, he opted for continued PSA surveillance. His PSA initially dropped down to 0.11 by 04/2020 but has since risen further to 0.2 in 10/2020.  The patient reviewed the PSA results with his urologist and he has kindly been referred back to [place] for discussion of potential salvage radiation treatment options.  PREVIOUS RADIATION THERAPY: No  PAST MEDICAL HISTORY:  Past Medical History:  Diagnosis Date  . ED (erectile dysfunction)   . Glucose intolerance (impaired glucose tolerance)   . Hyperlipidemia   . Left rotator cuff tear   . Migraine   . Near syncope    . Overweight   . Pre-diabetes   . Prostate cancer ([place])   . Seasonal allergies   . Syncopal episodes    heart related issues ruled out after many tests-other than heart catheterization -not done.      PAST SURGICAL HISTORY: Past Surgical History:  Procedure Laterality Date  . ACHILLES TENDON REPAIR     x2 '07  . COLONOSCOPY    . LYMPHADENECTOMY Bilateral 07/28/2016   Procedure: LYMPHADENECTOMY;  Surgeon: [name], MD;  Location: [facility];  Service: Urology;  Laterality: Bilateral;  . ROBOT ASSISTED LAPAROSCOPIC RADICAL PROSTATECTOMY N/A 07/28/2016   Procedure: XI ROBOTIC ASSISTED LAPAROSCOPIC RADICAL PROSTATECTOMY;  Surgeon: [name], MD;  Location: [facility];  Service: Urology;  Laterality: N/A;  . SPINE SURGERY     neck fusion '11"fell from tree"  . TONSILLECTOMY    . VASECTOMY      FAMILY HISTORY:  Family History  Problem Relation Age of Onset  . Stroke Mother   . Cancer Neg Hx     SOCIAL HISTORY:  Social History   Socioeconomic History  . Marital status: Married    Spouse name: Not on file  . Number of children: 4  . Years of education: Not on file  . Highest education level: Not on file  Occupational History  . 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 Smoking status: Never Smoker  . Smokeless tobacco: Never Used  Vaping Use  . Vaping  Use: Never used  Substance and Sexual Activity  . Alcohol use: Yes    Comment: Ocassional wine and beer  . Drug use: No  . Sexual activity: Yes    Comment: with aid of trimix  Other Topics Concern  . Not on file  Social History Narrative   3 living children 1 child passed in 2005 (killed by a drunk driver)   Social Determinants of Radio broadcast assistant Strain: Not on file  Food Insecurity: Not on file  Transportation Needs: Not on file  Physical Activity: Not on file  Stress: Not on file  Social Connections: Not on file  Intimate Partner Violence: Not  on file    ALLERGIES: Ambien [zolpidem tartrate]  MEDICATIONS:  Current Outpatient Medications  Medication Sig Dispense Refill  . aspirin EC 81 MG tablet Take 81 mg by mouth daily.    . cholecalciferol (VITAMIN D3) 25 MCG (1000 UT) tablet Take 1,000 Units by mouth daily.    . diphenhydrAMINE (BENADRYL) 25 MG tablet Take 25 mg by mouth every 6 (six) hours as needed (takes 2 , 50 mg total).    . fluticasone (FLONASE) 50 MCG/ACT nasal spray Place into the nose.    Marland Kitchen guaiFENesin (MUCINEX) 600 MG 12 hr tablet Take 600 mg by mouth daily.     Marland Kitchen ibuprofen (ADVIL) 400 MG tablet Take by mouth.    . loratadine (CLARITIN) 10 MG tablet Take 10 mg by mouth daily.    . NON FORMULARY Trimix injections to manage ED    . Omega-3 Fatty Acids (FISH OIL) 1000 MG CAPS Take by mouth.    . simvastatin (ZOCOR) 40 MG tablet Take 40 mg by mouth every morning.   0  . traZODone (DESYREL) 50 MG tablet Take 50 mg by mouth at bedtime.    . fexofenadine (ALLEGRA) 60 MG tablet Take 120 mg by mouth daily. (Patient not taking: Reported on 11/30/2020)     No current facility-administered medications for this encounter.    REVIEW OF SYSTEMS:  On review of systems, the patient reports that he is doing well overall. He denies any chest pain, shortness of breath, cough, fevers, chills, night sweats, unintended weight changes. He denies any bowel disturbances, and denies abdominal pain, nausea or vomiting. He denies any new musculoskeletal or joint aches or pains. His IPSS was 9, indicating moderate urinary symptoms. He reports leakage s/p prostatectomy but does not require use of pads or protective undergarments at this point. His SHIM was 17 with Trimix, indicating he has moderately controlled erectile dysfunction. A complete review of systems is obtained and is otherwise negative.  PHYSICAL EXAM:  Wt Readings from Last 3 Encounters:  11/30/20 199 lb 9.6 oz (90.5 kg)  11/29/19 191 lb (86.6 kg)  07/28/16 214 lb 15.2 oz (97.5  kg)   Temp Readings from Last 3 Encounters:  11/30/20 97.7 F (36.5 C) (Temporal)  07/29/16 98.2 F (36.8 C) (Oral)  07/17/16 98.2 F (36.8 C)   BP Readings from Last 3 Encounters:  11/30/20 131/69  07/29/16 103/64  07/17/16 132/73   Pulse Readings from Last 3 Encounters:  11/30/20 66  07/29/16 79  07/17/16 70   Pain Assessment Pain Score: 0-No pain/10  In general this is a well appearing Caucasian male in no acute distress. He's alert and oriented x4 and appropriate throughout the examination. Cardiopulmonary assessment is negative for acute distress and he exhibits normal effort.     KPS = 100  100 -  Normal; no complaints; no evidence of disease. 90   - Able to carry on normal activity; minor signs or symptoms of disease. 80   - Normal activity with effort; some signs or symptoms of disease. 72   - Cares for self; unable to carry on normal activity or to do active work. 60   - Requires occasional assistance, but is able to care for most of his personal needs. 50   - Requires considerable assistance and frequent medical care. 82   - Disabled; requires special care and assistance. 61   - Severely disabled; hospital admission is indicated although death not imminent. 57   - Very sick; hospital admission necessary; active supportive treatment necessary. 10   - Moribund; fatal processes progressing rapidly. 0     - Dead  Karnofsky DA, [place], Craver LS and Burchenal [facility] [phone]) The use of the nitrogen mustards in the palliative treatment of carcinoma: with particular reference to bronchogenic carcinoma Cancer 1 634-56  LABORATORY DATA:  Lab Results  Component Value Date   WBC 6.6 07/17/2016   HGB 14.0 07/29/2016   HCT 39.2 07/29/2016   MCV 88.4 07/17/2016   PLT 211 07/17/2016   Lab Results  Component Value Date   NA 140 07/17/2016   K 4.1 07/17/2016   CL 109 07/17/2016   CO2 25 07/17/2016   Lab Results  Component Value Date   ALT 32 09/14/2009   AST 23  09/14/2009   ALKPHOS 75 09/14/2009   BILITOT 0.8 09/14/2009     RADIOGRAPHY: No results found.    IMPRESSION/PLAN:  1. 68 y.o. gentleman with rising detectable PSA at 0.2 s/p prostatectomy in 07/2016 for Stage pT3a, pN0, Gleason 4+5 disease with focal ECE and PSA of 6.99 at diagnosis. Today, we reviewed the findings and workup thus far.  We discussed the natural history of prostate cancer.  We reviewed the the implications of extracapsular extension on the risk of prostate cancer recurrence, particularly in light of a detectable, rising postoperative PSA.  We discussed radiation treatment directed to the prostatic fossa with regard to the logistics and delivery of external beam radiation treatment. The recommendation is to proceed with a 7.5 week course of daily salvage radiotherapy to the prostate fossa and pelvic lymph nodes. We discussed and outlined the risks, benefits, short and long-term effects associated with radiotherapy in this setting. We also detailed the role of ADT in the treatment of biochemically recurrent high risk prostate cancer and outlined the associated side effects that could be expected with this therapy. We reviewed data from the [facility] trial regarding use of ADT in combination with salvage XRT to the prostate fossa and pelvic lymph nodes in the setting of biochemical recurrence.  This study shows that extending radiation therapy to the pelvic lymph nodes combined with adding short-term hormone therapy to standard treatment of the prostate surgical bed can extend the amount of time before disease progression but does not appear to demonstrate an overall survival benefit.  Therefore, it is felt that the toxicities associated with ADT and the negative impact it has on quality of life for patient's with minimal adverse pathology findings and low detectable PSA several years out from surgery, may outweigh the overall benefits of the treatment. Following a discussion of this topic with  his urologist and here today, the patient prefers to avoid ADT at this time and only consider the use of ADT should his PSA continue to rise despite salvage radiation alone. We are  in agreement with this decision. He was encouraged to ask questions that were answered to his stated satisfaction.  At the end of the conversation, the patient would like to proceed with salvage radiotherapy to the prostate fossa and pelvic lymph nodes without ADT. Since he lives in [place], we discussed the option to receive his daily treatments at [facility] in [place] under the care of Dr. Adella Nissen but he prefers to have his treatments here in [place]. We will share our discussion with Dr. Alinda Money and move forward with treatment planning.  He has freely signed written consent to proceed today in the office and a copy of this document will be placed in his medical record.  He is scheduled for CT simulation this afternoon at 2:30 PM in anticipation of beginning his daily treatments on 12/17/2020.  We enjoyed meeting with him today and look forward to continuing to participate in his care.   [name], PA-C    Tyler Pita, MD  [facility] Direct Dial: [phone]  Fax: [phone] [email or web address]  Skype  LinkedIn   This document serves as a record of services personally performed by Tyler Pita, MD and Freeman Caldron, PA-C. It was created on their behalf by Wilburn Mylar, a trained medical scribe. The creation of this record is based on the scribe's personal observations and the provider's statements to them. This document has been checked and approved by the attending provider.

## 2020-12-03 NOTE — Progress Notes (Signed)
Patient left message stating he will be receiving radiation treatments in [place]. He is scheduled for follow up consult 12/17.

## 2020-12-10 DIAGNOSIS — C61 Malignant neoplasm of prostate: Secondary | ICD-10-CM | POA: Diagnosis not present

## 2020-12-17 ENCOUNTER — Other Ambulatory Visit: Payer: Self-pay

## 2020-12-17 ENCOUNTER — Ambulatory Visit
Admission: RE | Admit: 2020-12-17 | Discharge: 2020-12-17 | Disposition: A | Discharge status: Home / Self Care | Payer: PPO | Source: Ambulatory Visit | Attending: Radiation Oncology | Admitting: Radiation Oncology

## 2020-12-17 DIAGNOSIS — C61 Malignant neoplasm of prostate: Secondary | ICD-10-CM | POA: Insufficient documentation

## 2020-12-18 ENCOUNTER — Ambulatory Visit
Admission: RE | Admit: 2020-12-18 | Discharge: 2020-12-18 | Disposition: A | Discharge status: Home / Self Care | Payer: PPO | Source: Ambulatory Visit | Attending: Radiation Oncology | Admitting: Radiation Oncology

## 2020-12-18 ENCOUNTER — Other Ambulatory Visit: Payer: Self-pay

## 2020-12-18 DIAGNOSIS — C61 Malignant neoplasm of prostate: Secondary | ICD-10-CM | POA: Diagnosis not present

## 2020-12-19 ENCOUNTER — Other Ambulatory Visit: Payer: Self-pay

## 2020-12-19 ENCOUNTER — Ambulatory Visit
Admission: RE | Admit: 2020-12-19 | Discharge: 2020-12-19 | Disposition: A | Discharge status: Home / Self Care | Payer: PPO | Source: Ambulatory Visit | Attending: Radiation Oncology | Admitting: Radiation Oncology

## 2020-12-19 DIAGNOSIS — H8113 Benign paroxysmal vertigo, bilateral: Secondary | ICD-10-CM | POA: Diagnosis not present

## 2020-12-19 DIAGNOSIS — Z6830 Body mass index (BMI) 30.0-30.9, adult: Secondary | ICD-10-CM | POA: Diagnosis not present

## 2020-12-19 DIAGNOSIS — C61 Malignant neoplasm of prostate: Secondary | ICD-10-CM | POA: Diagnosis not present

## 2020-12-19 DIAGNOSIS — M720 Palmar fascial fibromatosis [Dupuytren]: Secondary | ICD-10-CM | POA: Diagnosis not present

## 2020-12-19 DIAGNOSIS — R260 Ataxic gait: Secondary | ICD-10-CM | POA: Diagnosis not present

## 2020-12-20 ENCOUNTER — Other Ambulatory Visit: Payer: Self-pay

## 2020-12-20 ENCOUNTER — Ambulatory Visit
Admission: RE | Admit: 2020-12-20 | Discharge: 2020-12-20 | Disposition: A | Discharge status: Home / Self Care | Payer: PPO | Source: Ambulatory Visit | Attending: Radiation Oncology | Admitting: Radiation Oncology

## 2020-12-20 DIAGNOSIS — C61 Malignant neoplasm of prostate: Secondary | ICD-10-CM | POA: Diagnosis not present

## 2020-12-21 ENCOUNTER — Ambulatory Visit
Admission: RE | Admit: 2020-12-21 | Discharge: 2020-12-21 | Disposition: A | Discharge status: Home / Self Care | Payer: PPO | Source: Ambulatory Visit | Attending: Radiation Oncology | Admitting: Radiation Oncology

## 2020-12-21 ENCOUNTER — Other Ambulatory Visit: Payer: Self-pay

## 2020-12-21 DIAGNOSIS — C61 Malignant neoplasm of prostate: Secondary | ICD-10-CM | POA: Diagnosis not present

## 2020-12-24 ENCOUNTER — Other Ambulatory Visit: Payer: Self-pay

## 2020-12-24 ENCOUNTER — Ambulatory Visit
Admission: RE | Admit: 2020-12-24 | Discharge: 2020-12-24 | Disposition: A | Discharge status: Home / Self Care | Payer: PPO | Source: Ambulatory Visit | Attending: Radiation Oncology | Admitting: Radiation Oncology

## 2020-12-24 DIAGNOSIS — C61 Malignant neoplasm of prostate: Secondary | ICD-10-CM | POA: Diagnosis not present

## 2020-12-25 ENCOUNTER — Other Ambulatory Visit: Payer: Self-pay

## 2020-12-25 ENCOUNTER — Ambulatory Visit
Admission: RE | Admit: 2020-12-25 | Discharge: 2020-12-25 | Disposition: A | Discharge status: Home / Self Care | Payer: PPO | Source: Ambulatory Visit | Attending: Radiation Oncology | Admitting: Radiation Oncology

## 2020-12-25 DIAGNOSIS — C61 Malignant neoplasm of prostate: Secondary | ICD-10-CM | POA: Diagnosis not present

## 2020-12-26 ENCOUNTER — Ambulatory Visit
Admission: RE | Admit: 2020-12-26 | Discharge: 2020-12-26 | Disposition: A | Discharge status: Home / Self Care | Payer: PPO | Source: Ambulatory Visit | Attending: Radiation Oncology | Admitting: Radiation Oncology

## 2020-12-26 ENCOUNTER — Other Ambulatory Visit: Payer: Self-pay

## 2020-12-26 DIAGNOSIS — C61 Malignant neoplasm of prostate: Secondary | ICD-10-CM | POA: Diagnosis not present

## 2020-12-27 ENCOUNTER — Ambulatory Visit
Admission: RE | Admit: 2020-12-27 | Discharge: 2020-12-27 | Disposition: A | Discharge status: Home / Self Care | Payer: PPO | Source: Ambulatory Visit | Attending: Radiation Oncology | Admitting: Radiation Oncology

## 2020-12-27 DIAGNOSIS — C61 Malignant neoplasm of prostate: Secondary | ICD-10-CM | POA: Diagnosis not present

## 2020-12-28 ENCOUNTER — Ambulatory Visit
Admission: RE | Admit: 2020-12-28 | Discharge: 2020-12-28 | Disposition: A | Discharge status: Home / Self Care | Payer: PPO | Source: Ambulatory Visit | Attending: Radiation Oncology | Admitting: Radiation Oncology

## 2020-12-28 ENCOUNTER — Other Ambulatory Visit: Payer: Self-pay

## 2020-12-28 DIAGNOSIS — C61 Malignant neoplasm of prostate: Secondary | ICD-10-CM | POA: Diagnosis not present

## 2020-12-31 ENCOUNTER — Ambulatory Visit: Payer: PPO

## 2021-01-01 ENCOUNTER — Other Ambulatory Visit: Payer: Self-pay

## 2021-01-01 ENCOUNTER — Ambulatory Visit
Admission: RE | Admit: 2021-01-01 | Discharge: 2021-01-01 | Disposition: A | Discharge status: Home / Self Care | Payer: PPO | Source: Ambulatory Visit | Attending: Radiation Oncology | Admitting: Radiation Oncology

## 2021-01-01 DIAGNOSIS — C61 Malignant neoplasm of prostate: Secondary | ICD-10-CM | POA: Diagnosis not present

## 2021-01-02 ENCOUNTER — Ambulatory Visit
Admission: RE | Admit: 2021-01-02 | Discharge: 2021-01-02 | Disposition: A | Discharge status: Home / Self Care | Payer: PPO | Source: Ambulatory Visit | Attending: Radiation Oncology | Admitting: Radiation Oncology

## 2021-01-02 DIAGNOSIS — M79641 Pain in right hand: Secondary | ICD-10-CM | POA: Diagnosis not present

## 2021-01-02 DIAGNOSIS — M720 Palmar fascial fibromatosis [Dupuytren]: Secondary | ICD-10-CM | POA: Diagnosis not present

## 2021-01-02 DIAGNOSIS — C61 Malignant neoplasm of prostate: Secondary | ICD-10-CM | POA: Diagnosis not present

## 2021-01-03 ENCOUNTER — Other Ambulatory Visit: Payer: Self-pay

## 2021-01-03 ENCOUNTER — Ambulatory Visit
Admission: RE | Admit: 2021-01-03 | Discharge: 2021-01-03 | Disposition: A | Discharge status: Home / Self Care | Payer: PPO | Source: Ambulatory Visit | Attending: Radiation Oncology | Admitting: Radiation Oncology

## 2021-01-03 DIAGNOSIS — C61 Malignant neoplasm of prostate: Secondary | ICD-10-CM | POA: Diagnosis not present

## 2021-01-04 ENCOUNTER — Other Ambulatory Visit: Payer: Self-pay

## 2021-01-04 ENCOUNTER — Ambulatory Visit
Admission: RE | Admit: 2021-01-04 | Discharge: 2021-01-04 | Disposition: A | Discharge status: Home / Self Care | Payer: PPO | Source: Ambulatory Visit | Attending: Radiation Oncology | Admitting: Radiation Oncology

## 2021-01-04 DIAGNOSIS — C61 Malignant neoplasm of prostate: Secondary | ICD-10-CM | POA: Diagnosis not present

## 2021-01-04 DIAGNOSIS — R42 Dizziness and giddiness: Secondary | ICD-10-CM | POA: Diagnosis not present

## 2021-01-07 ENCOUNTER — Ambulatory Visit
Admission: RE | Admit: 2021-01-07 | Discharge: 2021-01-07 | Disposition: A | Discharge status: Home / Self Care | Payer: PPO | Source: Ambulatory Visit | Attending: Radiation Oncology | Admitting: Radiation Oncology

## 2021-01-07 DIAGNOSIS — C61 Malignant neoplasm of prostate: Secondary | ICD-10-CM | POA: Diagnosis not present

## 2021-01-08 ENCOUNTER — Ambulatory Visit
Admission: RE | Admit: 2021-01-08 | Discharge: 2021-01-08 | Disposition: A | Discharge status: Home / Self Care | Payer: PPO | Source: Ambulatory Visit | Attending: Radiation Oncology | Admitting: Radiation Oncology

## 2021-01-08 DIAGNOSIS — C61 Malignant neoplasm of prostate: Secondary | ICD-10-CM | POA: Diagnosis not present

## 2021-01-09 ENCOUNTER — Other Ambulatory Visit: Payer: Self-pay

## 2021-01-09 ENCOUNTER — Ambulatory Visit
Admission: RE | Admit: 2021-01-09 | Discharge: 2021-01-09 | Disposition: A | Discharge status: Home / Self Care | Payer: PPO | Source: Ambulatory Visit | Attending: Radiation Oncology | Admitting: Radiation Oncology

## 2021-01-09 DIAGNOSIS — C61 Malignant neoplasm of prostate: Secondary | ICD-10-CM | POA: Diagnosis not present

## 2021-01-10 ENCOUNTER — Ambulatory Visit
Admission: RE | Admit: 2021-01-10 | Discharge: 2021-01-10 | Disposition: A | Discharge status: Home / Self Care | Payer: PPO | Source: Ambulatory Visit | Attending: Radiation Oncology | Admitting: Radiation Oncology

## 2021-01-10 ENCOUNTER — Other Ambulatory Visit: Payer: Self-pay

## 2021-01-10 DIAGNOSIS — C61 Malignant neoplasm of prostate: Secondary | ICD-10-CM | POA: Diagnosis not present

## 2021-01-11 ENCOUNTER — Ambulatory Visit
Admission: RE | Admit: 2021-01-11 | Discharge: 2021-01-11 | Disposition: A | Discharge status: Home / Self Care | Payer: PPO | Source: Ambulatory Visit | Attending: Radiation Oncology | Admitting: Radiation Oncology

## 2021-01-11 ENCOUNTER — Other Ambulatory Visit: Payer: Self-pay

## 2021-01-11 DIAGNOSIS — R42 Dizziness and giddiness: Secondary | ICD-10-CM | POA: Diagnosis not present

## 2021-01-11 DIAGNOSIS — C61 Malignant neoplasm of prostate: Secondary | ICD-10-CM | POA: Diagnosis not present

## 2021-01-11 DIAGNOSIS — R260 Ataxic gait: Secondary | ICD-10-CM | POA: Diagnosis not present

## 2021-01-14 ENCOUNTER — Other Ambulatory Visit: Payer: Self-pay

## 2021-01-14 ENCOUNTER — Ambulatory Visit
Admission: RE | Admit: 2021-01-14 | Discharge: 2021-01-14 | Disposition: A | Discharge status: Home / Self Care | Payer: PPO | Source: Ambulatory Visit | Attending: Radiation Oncology | Admitting: Radiation Oncology

## 2021-01-14 DIAGNOSIS — C61 Malignant neoplasm of prostate: Secondary | ICD-10-CM | POA: Diagnosis not present

## 2021-01-15 ENCOUNTER — Ambulatory Visit
Admission: RE | Admit: 2021-01-15 | Discharge: 2021-01-15 | Disposition: A | Discharge status: Home / Self Care | Payer: PPO | Source: Ambulatory Visit | Attending: Radiation Oncology | Admitting: Radiation Oncology

## 2021-01-15 ENCOUNTER — Other Ambulatory Visit: Payer: Self-pay

## 2021-01-15 DIAGNOSIS — C61 Malignant neoplasm of prostate: Secondary | ICD-10-CM | POA: Diagnosis not present

## 2021-01-16 ENCOUNTER — Ambulatory Visit
Admission: RE | Admit: 2021-01-16 | Discharge: 2021-01-16 | Disposition: A | Discharge status: Home / Self Care | Payer: PPO | Source: Ambulatory Visit | Attending: Radiation Oncology | Admitting: Radiation Oncology

## 2021-01-16 ENCOUNTER — Other Ambulatory Visit: Payer: Self-pay

## 2021-01-16 DIAGNOSIS — C61 Malignant neoplasm of prostate: Secondary | ICD-10-CM | POA: Diagnosis not present

## 2021-01-17 ENCOUNTER — Other Ambulatory Visit: Payer: Self-pay

## 2021-01-17 ENCOUNTER — Ambulatory Visit
Admission: RE | Admit: 2021-01-17 | Discharge: 2021-01-17 | Disposition: A | Discharge status: Home / Self Care | Payer: PPO | Source: Ambulatory Visit | Attending: Radiation Oncology | Admitting: Radiation Oncology

## 2021-01-17 DIAGNOSIS — C61 Malignant neoplasm of prostate: Secondary | ICD-10-CM | POA: Diagnosis not present

## 2021-01-18 ENCOUNTER — Other Ambulatory Visit: Payer: Self-pay

## 2021-01-18 ENCOUNTER — Ambulatory Visit
Admission: RE | Admit: 2021-01-18 | Discharge: 2021-01-18 | Disposition: A | Discharge status: Home / Self Care | Payer: PPO | Source: Ambulatory Visit | Attending: Radiation Oncology | Admitting: Radiation Oncology

## 2021-01-18 DIAGNOSIS — C61 Malignant neoplasm of prostate: Secondary | ICD-10-CM | POA: Diagnosis not present

## 2021-01-21 ENCOUNTER — Ambulatory Visit
Admission: RE | Admit: 2021-01-21 | Discharge: 2021-01-21 | Disposition: A | Discharge status: Home / Self Care | Payer: PPO | Source: Ambulatory Visit | Attending: Radiation Oncology | Admitting: Radiation Oncology

## 2021-01-21 ENCOUNTER — Ambulatory Visit: Payer: PPO

## 2021-01-21 ENCOUNTER — Other Ambulatory Visit: Payer: Self-pay

## 2021-01-21 DIAGNOSIS — C61 Malignant neoplasm of prostate: Secondary | ICD-10-CM | POA: Diagnosis not present

## 2021-01-22 ENCOUNTER — Ambulatory Visit
Admission: RE | Admit: 2021-01-22 | Discharge: 2021-01-22 | Disposition: A | Discharge status: Home / Self Care | Payer: PPO | Source: Ambulatory Visit | Attending: Radiation Oncology | Admitting: Radiation Oncology

## 2021-01-22 ENCOUNTER — Other Ambulatory Visit: Payer: Self-pay

## 2021-01-22 ENCOUNTER — Ambulatory Visit: Payer: PPO

## 2021-01-22 DIAGNOSIS — C61 Malignant neoplasm of prostate: Secondary | ICD-10-CM | POA: Diagnosis not present

## 2021-01-23 ENCOUNTER — Ambulatory Visit
Admission: RE | Admit: 2021-01-23 | Discharge: 2021-01-23 | Disposition: A | Discharge status: Home / Self Care | Payer: PPO | Source: Ambulatory Visit | Attending: Radiation Oncology | Admitting: Radiation Oncology

## 2021-01-23 DIAGNOSIS — C61 Malignant neoplasm of prostate: Secondary | ICD-10-CM | POA: Diagnosis not present

## 2021-01-24 ENCOUNTER — Ambulatory Visit
Admission: RE | Admit: 2021-01-24 | Discharge: 2021-01-24 | Disposition: A | Discharge status: Home / Self Care | Payer: PPO | Source: Ambulatory Visit | Attending: Radiation Oncology | Admitting: Radiation Oncology

## 2021-01-24 DIAGNOSIS — C61 Malignant neoplasm of prostate: Secondary | ICD-10-CM | POA: Diagnosis not present

## 2021-01-25 ENCOUNTER — Ambulatory Visit
Admission: RE | Admit: 2021-01-25 | Discharge: 2021-01-25 | Disposition: A | Discharge status: Home / Self Care | Payer: PPO | Source: Ambulatory Visit | Attending: Radiation Oncology | Admitting: Radiation Oncology

## 2021-01-25 DIAGNOSIS — C61 Malignant neoplasm of prostate: Secondary | ICD-10-CM | POA: Diagnosis not present

## 2021-01-28 ENCOUNTER — Ambulatory Visit
Admission: RE | Admit: 2021-01-28 | Discharge: 2021-01-28 | Disposition: A | Discharge status: Home / Self Care | Payer: PPO | Source: Ambulatory Visit | Attending: Radiation Oncology | Admitting: Radiation Oncology

## 2021-01-28 DIAGNOSIS — C61 Malignant neoplasm of prostate: Secondary | ICD-10-CM | POA: Diagnosis not present

## 2021-01-29 ENCOUNTER — Other Ambulatory Visit: Payer: Self-pay

## 2021-01-29 ENCOUNTER — Ambulatory Visit
Admission: RE | Admit: 2021-01-29 | Discharge: 2021-01-29 | Disposition: A | Discharge status: Home / Self Care | Payer: PPO | Source: Ambulatory Visit | Attending: Radiation Oncology | Admitting: Radiation Oncology

## 2021-01-29 DIAGNOSIS — C61 Malignant neoplasm of prostate: Secondary | ICD-10-CM | POA: Diagnosis not present

## 2021-01-30 ENCOUNTER — Ambulatory Visit
Admission: RE | Admit: 2021-01-30 | Discharge: 2021-01-30 | Disposition: A | Discharge status: Home / Self Care | Payer: PPO | Source: Ambulatory Visit | Attending: Radiation Oncology | Admitting: Radiation Oncology

## 2021-01-30 ENCOUNTER — Other Ambulatory Visit: Payer: Self-pay

## 2021-01-30 DIAGNOSIS — C61 Malignant neoplasm of prostate: Secondary | ICD-10-CM | POA: Diagnosis not present

## 2021-01-31 ENCOUNTER — Other Ambulatory Visit: Payer: Self-pay

## 2021-01-31 ENCOUNTER — Ambulatory Visit
Admission: RE | Admit: 2021-01-31 | Discharge: 2021-01-31 | Disposition: A | Discharge status: Home / Self Care | Payer: PPO | Source: Ambulatory Visit | Attending: Radiation Oncology | Admitting: Radiation Oncology

## 2021-01-31 DIAGNOSIS — C61 Malignant neoplasm of prostate: Secondary | ICD-10-CM | POA: Diagnosis not present

## 2021-02-01 ENCOUNTER — Ambulatory Visit
Admission: RE | Admit: 2021-02-01 | Discharge: 2021-02-01 | Disposition: A | Discharge status: Home / Self Care | Payer: PPO | Source: Ambulatory Visit | Attending: Radiation Oncology | Admitting: Radiation Oncology

## 2021-02-01 DIAGNOSIS — C61 Malignant neoplasm of prostate: Secondary | ICD-10-CM | POA: Diagnosis not present

## 2021-02-04 ENCOUNTER — Other Ambulatory Visit: Payer: Self-pay

## 2021-02-04 ENCOUNTER — Ambulatory Visit
Admission: RE | Admit: 2021-02-04 | Discharge: 2021-02-04 | Disposition: A | Discharge status: Home / Self Care | Payer: PPO | Source: Ambulatory Visit | Attending: Radiation Oncology | Admitting: Radiation Oncology

## 2021-02-04 DIAGNOSIS — C61 Malignant neoplasm of prostate: Secondary | ICD-10-CM | POA: Diagnosis not present

## 2021-02-05 ENCOUNTER — Ambulatory Visit
Admission: RE | Admit: 2021-02-05 | Discharge: 2021-02-05 | Disposition: A | Discharge status: Home / Self Care | Payer: PPO | Source: Ambulatory Visit | Attending: Radiation Oncology | Admitting: Radiation Oncology

## 2021-02-05 ENCOUNTER — Other Ambulatory Visit: Payer: Self-pay

## 2021-02-05 DIAGNOSIS — C61 Malignant neoplasm of prostate: Secondary | ICD-10-CM | POA: Diagnosis not present

## 2021-02-06 ENCOUNTER — Other Ambulatory Visit: Payer: Self-pay

## 2021-02-06 ENCOUNTER — Ambulatory Visit
Admission: RE | Admit: 2021-02-06 | Discharge: 2021-02-06 | Disposition: A | Discharge status: Home / Self Care | Payer: PPO | Source: Ambulatory Visit | Attending: Radiation Oncology | Admitting: Radiation Oncology

## 2021-02-06 ENCOUNTER — Ambulatory Visit: Payer: PPO

## 2021-02-06 DIAGNOSIS — C61 Malignant neoplasm of prostate: Secondary | ICD-10-CM | POA: Diagnosis not present

## 2021-02-07 ENCOUNTER — Encounter: Payer: Self-pay | Admitting: Radiation Oncology

## 2021-02-07 ENCOUNTER — Ambulatory Visit
Admission: RE | Admit: 2021-02-07 | Discharge: 2021-02-07 | Disposition: A | Discharge status: Home / Self Care | Payer: PPO | Source: Ambulatory Visit | Attending: Radiation Oncology | Admitting: Radiation Oncology

## 2021-02-07 DIAGNOSIS — C61 Malignant neoplasm of prostate: Secondary | ICD-10-CM | POA: Diagnosis not present

## 2021-02-20 DIAGNOSIS — M720 Palmar fascial fibromatosis [Dupuytren]: Secondary | ICD-10-CM | POA: Diagnosis not present

## 2021-02-20 NOTE — Progress Notes (Signed)
  Radiation Oncology         (336) [phone] ________________________________  Name: Gregory Middleton MRN: [id number]  Date: 02/07/2021  DOB: April 13, 1951  End of Treatment Note  Diagnosis:   70 y.o. gentleman with rising detectable PSA on 0.17 s/p prostatectomy in 07/2016 for Stage pT3a, pN0, Gleason 4+5 disease with focal ECE and PSA of 6.99 at diagnosis.     Indication for treatment:  Curative, Definitive Radiotherapy       Radiation treatment dates:   1/3-2/24/22  Site/dose:  1. The prostate fossa and pelvic lymph nodes were initially treated to 45 Gy in 25 fractions of 1.8 Gy  2. The prostate fossa only was boosted to 68.4 Gy with 13 additional fractions of 1.8 Gy   Beams/energy:  1. The prostate fossa  and pelvic lymph nodes were initially treated using VMAT intensity modulated radiotherapy delivering 6 megavolt photons. Image guidance was performed with CB-CT studies prior to each fraction. He was immobilized with a body fix lower extremity mold.  2. The prostate fossa only was boosted using VMAT intensity modulated radiotherapy delivering 6 megavolt photons. Image guidance was performed with CB-CT studies prior to each fraction. He was immobilized with a body fix lower extremity mold.  Narrative: The patient tolerated radiation treatment relatively well.   The patient experienced some minor urinary irritation and modest fatigue.    Plan: The patient has completed radiation treatment. He will return to radiation oncology clinic for routine followup in one month. I advised him to call or return sooner if he has any questions or concerns related to his recovery or treatment. ________________________________  Sheral Apley. Tammi Klippel, M.D.

## 2021-02-21 DIAGNOSIS — C61 Malignant neoplasm of prostate: Secondary | ICD-10-CM | POA: Diagnosis not present

## 2021-02-21 DIAGNOSIS — M720 Palmar fascial fibromatosis [Dupuytren]: Secondary | ICD-10-CM | POA: Diagnosis not present

## 2021-02-21 DIAGNOSIS — E782 Mixed hyperlipidemia: Secondary | ICD-10-CM | POA: Diagnosis not present

## 2021-02-21 DIAGNOSIS — E7849 Other hyperlipidemia: Secondary | ICD-10-CM | POA: Diagnosis not present

## 2021-02-21 DIAGNOSIS — I700 Atherosclerosis of aorta: Secondary | ICD-10-CM | POA: Diagnosis not present

## 2021-02-21 DIAGNOSIS — K5730 Diverticulosis of large intestine without perforation or abscess without bleeding: Secondary | ICD-10-CM | POA: Diagnosis not present

## 2021-02-21 DIAGNOSIS — Z6831 Body mass index (BMI) 31.0-31.9, adult: Secondary | ICD-10-CM | POA: Diagnosis not present

## 2021-02-21 DIAGNOSIS — E559 Vitamin D deficiency, unspecified: Secondary | ICD-10-CM | POA: Diagnosis not present

## 2021-02-21 DIAGNOSIS — H8113 Benign paroxysmal vertigo, bilateral: Secondary | ICD-10-CM | POA: Diagnosis not present

## 2021-02-21 DIAGNOSIS — R7303 Prediabetes: Secondary | ICD-10-CM | POA: Diagnosis not present

## 2021-02-21 DIAGNOSIS — K760 Fatty (change of) liver, not elsewhere classified: Secondary | ICD-10-CM | POA: Diagnosis not present

## 2021-02-21 DIAGNOSIS — G4709 Other insomnia: Secondary | ICD-10-CM | POA: Diagnosis not present

## 2021-02-26 DIAGNOSIS — H9313 Tinnitus, bilateral: Secondary | ICD-10-CM | POA: Diagnosis not present

## 2021-02-26 DIAGNOSIS — J342 Deviated nasal septum: Secondary | ICD-10-CM | POA: Diagnosis not present

## 2021-02-26 DIAGNOSIS — H903 Sensorineural hearing loss, bilateral: Secondary | ICD-10-CM | POA: Diagnosis not present

## 2021-02-26 DIAGNOSIS — R42 Dizziness and giddiness: Secondary | ICD-10-CM | POA: Diagnosis not present

## 2021-02-26 DIAGNOSIS — J310 Chronic rhinitis: Secondary | ICD-10-CM | POA: Diagnosis not present

## 2021-03-07 ENCOUNTER — Ambulatory Visit: Payer: Self-pay | Admitting: Urology

## 2021-03-13 DIAGNOSIS — J309 Allergic rhinitis, unspecified: Secondary | ICD-10-CM | POA: Diagnosis not present

## 2021-03-14 ENCOUNTER — Ambulatory Visit
Admission: RE | Admit: 2021-03-14 | Discharge: 2021-03-14 | Disposition: A | Discharge status: Home / Self Care | Payer: PPO | Source: Ambulatory Visit | Attending: Urology | Admitting: Urology

## 2021-03-14 ENCOUNTER — Other Ambulatory Visit: Payer: Self-pay

## 2021-03-14 DIAGNOSIS — C61 Malignant neoplasm of prostate: Secondary | ICD-10-CM

## 2021-03-14 NOTE — Progress Notes (Signed)
Radiation Oncology         (336) [phone] ________________________________  Name: Gregory Middleton MRN: [id number]  Date: 03/14/2021  DOB: 1951/04/05  Post Treatment Note  CC: [name], MD  [name], MD  Diagnosis:   70 y.o. gentleman with rising detectable PSA on 0.17 s/p prostatectomy in 07/2016 for Stage pT3a, pN0, Gleason 4+5 disease with focal ECE and PSA of 6.99 at diagnosis.     Interval Since Last Radiation:  5 weeks  1/3-2/24/22: 1. The prostate fossa and pelvic lymph nodes were initially treated to 45 Gy in 25 fractions of 1.8 Gy  2. The prostate fossa only was boosted to 68.4 Gy with 13 additional fractions of 1.8 Gy   Narrative:  The patient returns today for routine follow-up.  He tolerated radiation treatment relatively well with only minor urinary irritation and modest fatigue.  He did experience increased frequency, urgency and feelings of incomplete bladder emptying.  He also reported frequent, loose bowel movements but denied abdominal pain, nausea, vomiting, diarrhea or constipation.                           On review of systems, the patient states that he is doing well in general. His current IPSS score is 10, indicating mild to moderate urinary symptoms only with nocturia x3 and residual urgency. He reports that the frequency has improved significantly and he specifically denies dysuria, gross hematuria, straining to void, hesitancy, intermittency, weak stream or incomplete emptying. He continues with mild, occasional UUI but reports that this is not significantly increased as compared to his post-op incontinence. He also feels that the bowels are gradually returning to normal at this point. He denies abdominal pain, nausea, vomiting, diarrhea or constipation. He does have moderate residual fatigue but, overall, he is pleased with his progress to date.  ALLERGIES:  is allergic to [employer] tartrate].  Meds: Current Outpatient Medications  Medication  Sig Dispense Refill  . aspirin EC 81 MG tablet Take 81 mg by mouth daily.    . cholecalciferol (VITAMIN D3) 25 MCG (1000 UT) tablet Take 1,000 Units by mouth daily.    . diphenhydrAMINE (BENADRYL) 25 MG tablet Take 25 mg by mouth every 6 (six) hours as needed (takes 2 , 50 mg total).    . fluticasone (FLONASE) 50 MCG/ACT nasal spray Place into the nose.    Marland Kitchen guaiFENesin (MUCINEX) 600 MG 12 hr tablet Take 600 mg by mouth daily.     Marland Kitchen ibuprofen (ADVIL) 400 MG tablet Take by mouth.    . mometasone (NASONEX) 50 MCG/ACT nasal spray SHAKE LIQUID AND USE 2 SPRAYS IN EACH NOSTRIL EVERY DAY    . NON FORMULARY Trimix injections to manage ED    . Omega-3 Fatty Acids (FISH OIL) 1000 MG CAPS Take by mouth.    . Propylene Glycol (SYSTANE COMPLETE) 0.6 % SOLN 1 drop.    . simvastatin (ZOCOR) 40 MG tablet Take 40 mg by mouth every morning.   0  . traZODone (DESYREL) 50 MG tablet Take 50 mg by mouth at bedtime.    . fexofenadine (ALLEGRA) 60 MG tablet Take 120 mg by mouth daily. (Patient not taking: No sig reported)    . loratadine (CLARITIN) 10 MG tablet Take 10 mg by mouth daily. (Patient not taking: Reported on 03/14/2021)    . montelukast (SINGULAIR) 10 MG tablet Take 1 tablet by mouth daily. (Patient not taking: Reported on 03/14/2021)    .  traZODone (DESYREL) 50 MG tablet Take by mouth. (Patient not taking: Reported on 03/14/2021)     No current facility-administered medications for this encounter.    Physical Findings:  vitals were not taken for this visit.   /10 Unable to assess due to telephone follow-up visit format.  Lab Findings: Lab Results  Component Value Date   WBC 6.6 07/17/2016   HGB 14.0 07/29/2016   HCT 39.2 07/29/2016   MCV 88.4 07/17/2016   PLT 211 07/17/2016     Radiographic Findings: No results found.  Impression/Plan: 1. 70 y.o. gentleman with rising detectable PSA on 0.17 s/p prostatectomy in 07/2016 for Stage pT3a, pN0, Gleason 4+5 disease with focal ECE and PSA of 6.99  at diagnosis.    He will continue to follow up with urology for ongoing PSA determinations and has an appointment scheduled for labs on 05/16/2021 Dr. Alinda Money the following week. He understands what to expect with regards to PSA monitoring going forward. I will look forward to following his response to treatment via correspondence with urology, and would be happy to continue to participate in his care if clinically indicated. I talked to the patient about what to expect in the future, including his risk for erectile dysfunction and rectal bleeding. I encouraged him to call or return to the office if he has any questions regarding his previous radiation or possible radiation side effects. He was comfortable with this plan and will follow up as needed.    Gregory Johns, PA-C

## 2021-03-14 NOTE — Progress Notes (Signed)
Patient states that he has moderate to serve fatigue. Patient reports urgency with urination. Patient denies any hematuria or dysuria. Patient reports nocturia x 3-5. Patient states that he empties his bladder during the day ,but not at night.Patient reports a moderate to strong stream. Patient reports some leakage. Patient denies having to strain or push to start stream.Patient states that his next appointment with the urologist is in June. Patient is unsure of the day.Patient  states that   he was having constipation ,but that it is getting better.Patient IPPS was a 10.

## 2021-03-15 DIAGNOSIS — J019 Acute sinusitis, unspecified: Secondary | ICD-10-CM | POA: Diagnosis not present

## 2021-03-15 DIAGNOSIS — J309 Allergic rhinitis, unspecified: Secondary | ICD-10-CM | POA: Diagnosis not present

## 2021-05-07 DIAGNOSIS — M720 Palmar fascial fibromatosis [Dupuytren]: Secondary | ICD-10-CM | POA: Diagnosis not present

## 2021-05-15 DIAGNOSIS — H47012 Ischemic optic neuropathy, left eye: Secondary | ICD-10-CM

## 2021-05-15 HISTORY — DX: Ischemic optic neuropathy, left eye: H47.012

## 2021-05-16 DIAGNOSIS — C61 Malignant neoplasm of prostate: Secondary | ICD-10-CM | POA: Diagnosis not present

## 2021-05-24 DIAGNOSIS — N5201 Erectile dysfunction due to arterial insufficiency: Secondary | ICD-10-CM | POA: Diagnosis not present

## 2021-05-24 DIAGNOSIS — C61 Malignant neoplasm of prostate: Secondary | ICD-10-CM | POA: Diagnosis not present

## 2021-05-24 DIAGNOSIS — R3915 Urgency of urination: Secondary | ICD-10-CM | POA: Diagnosis not present

## 2021-06-03 DIAGNOSIS — M720 Palmar fascial fibromatosis [Dupuytren]: Secondary | ICD-10-CM | POA: Diagnosis not present

## 2021-06-03 DIAGNOSIS — M25641 Stiffness of right hand, not elsewhere classified: Secondary | ICD-10-CM | POA: Diagnosis not present

## 2021-06-07 DIAGNOSIS — H47093 Other disorders of optic nerve, not elsewhere classified, bilateral: Secondary | ICD-10-CM | POA: Diagnosis not present

## 2021-06-08 DIAGNOSIS — H5319 Other subjective visual disturbances: Secondary | ICD-10-CM | POA: Diagnosis not present

## 2021-06-10 DIAGNOSIS — H538 Other visual disturbances: Secondary | ICD-10-CM | POA: Diagnosis not present

## 2021-06-10 DIAGNOSIS — R9389 Abnormal findings on diagnostic imaging of other specified body structures: Secondary | ICD-10-CM | POA: Diagnosis not present

## 2021-06-10 DIAGNOSIS — H4710 Unspecified papilledema: Secondary | ICD-10-CM | POA: Diagnosis not present

## 2021-06-10 DIAGNOSIS — H5319 Other subjective visual disturbances: Secondary | ICD-10-CM | POA: Diagnosis not present

## 2021-06-11 DIAGNOSIS — H469 Unspecified optic neuritis: Secondary | ICD-10-CM | POA: Diagnosis not present

## 2021-06-25 ENCOUNTER — Encounter: Payer: Self-pay | Admitting: Neurology

## 2021-06-25 ENCOUNTER — Other Ambulatory Visit: Payer: Self-pay

## 2021-06-25 ENCOUNTER — Ambulatory Visit (INDEPENDENT_AMBULATORY_CARE_PROVIDER_SITE_OTHER): Payer: PPO | Admitting: Neurology

## 2021-06-25 VITALS — BP 151/76 | HR 65 | Ht 67.5 in | Wt 210.0 lb

## 2021-06-25 DIAGNOSIS — H47012 Ischemic optic neuropathy, left eye: Secondary | ICD-10-CM | POA: Diagnosis not present

## 2021-06-25 DIAGNOSIS — H53132 Sudden visual loss, left eye: Secondary | ICD-10-CM | POA: Diagnosis not present

## 2021-06-25 DIAGNOSIS — H4710 Unspecified papilledema: Secondary | ICD-10-CM | POA: Diagnosis not present

## 2021-06-25 NOTE — Patient Instructions (Addendum)
We will check labs and an ultrasound of the carotid arteries.   On the ultrasound, 1-39% stenosis would be normal for age

## 2021-06-25 NOTE — Progress Notes (Addendum)
[facility]  PATIENT: Gregory Middleton DOB: December 01, 1951  REFERRING DOCTOR OR PCP: Leticia Clas, OD; Florene Route, MD (PCP) SOURCE: Patient, notes from ophthalmology, imaging reports, MRI images personally reviewed.  _________________________________   HISTORICAL  CHIEF COMPLAINT:  Chief Complaint  Patient presents with   New Patient (Initial Visit)    Rm 1, w wife [name]. Here for left optic neuritis, referred by [facility] for further eval. Pt left eye now has floaters/"spider like" at times looks "like a worm hole" with a grey area across. when both's eyes are open the greyness disappears, but doesn't get rid of the floaters/ "spider like" that moves when he focus.     HISTORY OF PRESENT ILLNESS:  I had the pleasure of seeing patient, Gregory Middleton, at [facility] for neurologic consultation regarding 70 his left optic neuritis.  He is a 70 year old man who had the onset of left visual disturbance several weeks ago in June 2022.  The onset was not associated with any pain.  He has not had jaw claudication or shoulder pain/headache.  He was found to have optic nerve edema on the left.  He has had some other symptoms over the last year.  In August 2021 he had left unilateral diplopia that resolved after a few weeks.    He saw Dr. Manuella Ghazi at that time.  He was advised to use eyedrops as eyes may have been dry.     He had some dizziness for a few days in September 2021 and for a few days in December 2021 and in late January 2022.    After the first episdoe, meclizine was prescribed and was not well tolerated.    MRI of the brain  01/11/2021 was normal for age.    Symptoms resolved over a few weeks.  In mid June 2022, he had the onset of a gray bar over his vision.  He saw Dr. Rosana Hoes who noted disc edema OS and referred him to Dr. Manuella Ghazi.  On 06/11/2021, he was seen at [facility] [place]) for disc edema on the left.  Visual  acuity was 20/40 OS and 20/20 OD.  There was left disc edema.  The exam was otherwise fairly normal except for mild cataracts.   He feels symptoms are unchanged or slightly worse since 06/11/2021.  He began to note floater and spiderlike abnormalities in his vision after the eye exam.  IMAGING: MRI of the brain and orbits was performed 06/10/2021.  Images were personally reviewed.  The MRI of the brain is normal for age.  The MRI of the orbits shows enhancement of the left optic nerve just behind the globe.  Vascular risks:   He has hypercholesterolemia (on simvastatin x many years)He was prediabetic but this improved with weight loss.  He does not snore or have OSA symptoms.     Other history:   He has prostate cancer (Aggressive Gleason 9)treated with radiation earlier this year.        He had ESR which was normal.  REVIEW OF SYSTEMS: Constitutional: No fevers, chills, sweats, or change in appetite Eyes: No visual changes, double vision, eye pain Ear, nose and throat: No hearing loss, ear pain, nasal congestion, sore throat Cardiovascular: No chest pain, palpitations Respiratory:  No shortness of breath at rest or with exertion.   No wheezes GastrointestinaI: No nausea, vomiting, diarrhea, abdominal pain, fecal incontinence Genitourinary:  No dysuria, urinary retention or frequency.  No nocturia. Musculoskeletal:  No  neck pain, back pain Integumentary: No rash, pruritus, skin lesions Neurological: as above Psychiatric: No depression at this time.  No anxiety Endocrine: No palpitations, diaphoresis, change in appetite, change in weigh or increased thirst Hematologic/Lymphatic:  No anemia, purpura, petechiae. Allergic/Immunologic: No itchy/runny eyes, nasal congestion, recent allergic reactions, rashes  ALLERGIES: Allergies  Allergen Reactions   Zolpidem Other (See Comments)    Wild behavior   Ambien [Zolpidem Tartrate] Other (See Comments)    Wild behavior    HOME  MEDICATIONS:  Current Outpatient Medications:    aspirin EC 81 MG tablet, Take 81 mg by mouth daily., Disp: , Rfl:    cholecalciferol (VITAMIN D3) 25 MCG (1000 UT) tablet, Take 1,000 Units by mouth daily., Disp: , Rfl:    diphenhydrAMINE (BENADRYL) 25 MG tablet, Take 25 mg by mouth every 6 (six) hours as needed (takes 2 , 50 mg total)., Disp: , Rfl:    fluticasone (FLONASE) 50 MCG/ACT nasal spray, Place into the nose., Disp: , Rfl:    guaiFENesin (MUCINEX) 600 MG 12 hr tablet, Take 600 mg by mouth daily. , Disp: , Rfl:    ibuprofen (ADVIL) 400 MG tablet, Take by mouth., Disp: , Rfl:    loratadine (CLARITIN) 10 MG tablet, Take 10 mg by mouth daily., Disp: , Rfl:    mometasone (NASONEX) 50 MCG/ACT nasal spray, SHAKE LIQUID AND USE 2 SPRAYS IN EACH NOSTRIL EVERY DAY, Disp: , Rfl:    NON FORMULARY, Trimix injections to manage ED, Disp: , Rfl:    Omega-3 Fatty Acids (FISH OIL) 1000 MG CAPS, Take by mouth., Disp: , Rfl:    Propylene Glycol (SYSTANE COMPLETE) 0.6 % SOLN, 1 drop., Disp: , Rfl:    simvastatin (ZOCOR) 40 MG tablet, Take 40 mg by mouth every morning. , Disp: , Rfl: 0   traZODone (DESYREL) 50 MG tablet, Take 50 mg by mouth at bedtime., Disp: , Rfl:    traZODone (DESYREL) 50 MG tablet, Take by mouth., Disp: , Rfl:   PAST MEDICAL HISTORY: Past Medical History:  Diagnosis Date   ED (erectile dysfunction)    Glucose intolerance (impaired glucose tolerance)    Hyperlipidemia    Left rotator cuff tear    Migraine    Near syncope    Overweight    Pre-diabetes    Prostate cancer ([place])    Seasonal allergies    Syncopal episodes    heart related issues ruled out after many tests-other than heart catheterization -not done.    PAST SURGICAL HISTORY: Past Surgical History:  Procedure Laterality Date   ACHILLES TENDON REPAIR     x2 '07   COLONOSCOPY     LYMPHADENECTOMY Bilateral 07/28/2016   Procedure: LYMPHADENECTOMY;  Surgeon: [name], MD;  Location: [facility];  Service: Urology;   Laterality: Bilateral;   ROBOT ASSISTED LAPAROSCOPIC RADICAL PROSTATECTOMY N/A 07/28/2016   Procedure: XI ROBOTIC ASSISTED LAPAROSCOPIC RADICAL PROSTATECTOMY;  Surgeon: [name], MD;  Location: [facility];  Service: Urology;  Laterality: N/A;   SPINE SURGERY     neck fusion '11"fell from tree"   TONSILLECTOMY     VASECTOMY      FAMILY HISTORY: Family History  Problem Relation Age of Onset   Stroke Mother    Cancer Neg Hx     SOCIAL HISTORY:  Social History   Socioeconomic History   Marital status: Married    Spouse name: Not on file   Number of children: 4   Years of education: Not on file  Highest education level: Not on file  Occupational History   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: [employer]    Comment: full time  Tobacco Use   Smoking status: Never   Smokeless tobacco: Never  Vaping Use   Vaping Use: Never used  Substance and Sexual Activity   Alcohol use: Yes    Comment: Ocassional wine and beer   Drug use: No   Sexual activity: Yes    Comment: with aid of trimix  Other Topics Concern   Not on file  Social History Narrative   3 living children 1 child passed in 2005 (killed by a drunk driver)   Social Determinants of Radio broadcast assistant Strain: Not on file  Food Insecurity: Not on file  Transportation Needs: Not on file  Physical Activity: Not on file  Stress: Not on file  Social Connections: Not on file  Intimate Partner Violence: Not on file     PHYSICAL EXAM  Vitals:   06/25/21 1323  BP: (!) 151/76  Pulse: 65  Weight: 210 lb (95.3 kg)  Height: 5' 7.5" (1.715 m)    Body mass index is 32.41 kg/m.   General: The patient is well-developed and well-nourished and in no acute distress  HEENT:  Head is [place]/AT.  Sclera are anicteric.  Funduscopic exam show showed mild left disc edema.  The optic disc was normal on the right..  Neck: No carotid bruits are noted.  The neck is nontender.  Cardiovascular: The heart has a  regular rate and rhythm with a normal S1 and S2. There were no murmurs, gallops or rubs.    Skin: Extremities are without rash or  edema.  Musculoskeletal:  Back is nontender  Neurologic Exam  Mental status: The patient is alert and oriented x 3 at the time of the examination. The patient has apparent normal recent and remote memory, with an apparently normal attention span and concentration ability.   Speech is normal.  Cranial nerves: Extraocular movements are full.  There was 1+ left APD.  Facial symmetry is present. There is good facial sensation to soft touch bilaterally.Facial strength is normal.  Trapezius and sternocleidomastoid strength is normal. No dysarthria is noted.  The tongue is midline, and the patient has symmetric elevation of the soft palate. No obvious hearing deficits are noted.  Motor:  Muscle bulk is normal.   Tone is normal. Strength is  5 / 5 in all 4 extremities.   Sensory: Sensory testing is intact to pinprick, soft touch and vibration sensation in arms and very slight loss of vibration in toes (ankles normal).  Coordination: Cerebellar testing reveals good finger-nose-finger and heel-to-shin bilaterally.  Gait and station: Station is normal.   Gait is normal. Tandem gait is normal. Romberg is negative.   Reflexes: Deep tendon reflexes are symmetric and normal bilaterally.   Plantar responses are flexor.     DIAGNOSTIC DATA (LABS, IMAGING, TESTING) - I reviewed patient records, labs, notes, testing and imaging myself where available.  Lab Results  Component Value Date   WBC 6.6 07/17/2016   HGB 14.0 07/29/2016   HCT 39.2 07/29/2016   MCV 88.4 07/17/2016   PLT 211 07/17/2016      Component Value Date/Time   NA 140 07/17/2016 1540   K 4.1 07/17/2016 1540   CL 109 07/17/2016 1540   CO2 25 07/17/2016 1540   GLUCOSE 122 (H) 07/17/2016 1540   BUN 27 (H) 07/17/2016 1540   CREATININE 0.98  07/17/2016 1540   CALCIUM 9.0 07/17/2016 1540   PROT 7.9  09/14/2009 2138   ALBUMIN 4.7 09/14/2009 2138   AST 23 09/14/2009 2138   ALT 32 09/14/2009 2138   ALKPHOS 75 09/14/2009 2138   BILITOT 0.8 09/14/2009 2138   GFRNONAA >60 07/17/2016 1540   GFRAA >60 07/17/2016 1540   Lab Results  Component Value Date   CHOL  09/15/2009    88        ATP III CLASSIFICATION:  <200     mg/dL   Desirable  200-239  mg/dL   Borderline High  >=240    mg/dL   High          HDL 29 (L) 09/15/2009   LDLCALC  09/15/2009    37        Total Cholesterol/HDL:CHD Risk Coronary Heart Disease Risk Table                     Men   Women  1/2 Average Risk   3.4   3.3  Average Risk       5.0   4.4  2 X Average Risk   9.6   7.1  3 X Average Risk  23.4   11.0        Use the calculated Patient Ratio above and the CHD Risk Table to determine the patient's CHD Risk.        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 Mcglocklin is a 70 year old man who had the onset of left visual loss associated with optic nerve edema in June 2022.  I believe he most likely has a nonarteritic anterior ischemic optic neuropathy due to the painless nature of the onset and his age.  There is no evidence of a compressive optic neuropathy on the MRI.  He is less likely to  have optic neuritis and there was no evidence of demyelination on the MRI.  Order arteritic anterior ischemic optic neuropathy/ giant cell arteritis is unlikely due to the painless onset and he has a reported normal ESR.   I will check blood work including ESR and CRP to further help to rule out an arteritic cause.  Though demyelination is unlikely at his age, I will also check the anti-NMO antibody.  His only vascular risk is elevated cholesterol.  However we need to check a carotid ultrasound to make sure that he does not have significant stenosis that could be playing a role.  I discussed with him and his wife that NAION is more likely in individuals who have vascular risk factors.  He is already taking aspirin.  He does not have OSA symptoms.  I also discussed that about half the people with AION do not have any noticeable improvement but about one quarter of people will regain significant vision and another quarter will regain some vision.  If this were to happen I would expect some improvement over the first few months.  Follow-up may be arranged based on the results of the studies and he should call us if he has any new or worsening neurologic symptoms.  Thank you for asking me to see Mr. Ardine Eng.  Please let me know if I can be of further assistance with him or other patients in the future.    Kristien Salatino A. Felecia Shelling, MD, [facility] 4/59/1368, 5:99 PM Certified in Neurology, Clinical Neurophysiology, Sleep Medicine and Neuroimaging  [facility] [address], [place], [place] [address] [phone]

## 2021-06-26 DIAGNOSIS — H47012 Ischemic optic neuropathy, left eye: Secondary | ICD-10-CM | POA: Diagnosis not present

## 2021-06-26 DIAGNOSIS — H348121 Central retinal vein occlusion, left eye, with retinal neovascularization: Secondary | ICD-10-CM | POA: Diagnosis not present

## 2021-06-26 LAB — SEDIMENTATION RATE: Sed Rate: 2 mm/hr (ref 0–30)

## 2021-06-26 LAB — NEUROMYELITIS OPTICA AUTOAB, IGG: NMO IgG Autoantibodies: <1.5 U/mL (ref 0.0–3.0)

## 2021-06-26 LAB — C-REACTIVE PROTEIN: CRP: <1 mg/L (ref 0–10)

## 2021-07-15 DIAGNOSIS — Z20828 Contact with and (suspected) exposure to other viral communicable diseases: Secondary | ICD-10-CM | POA: Diagnosis not present

## 2021-07-16 ENCOUNTER — Other Ambulatory Visit: Payer: Self-pay

## 2021-07-16 ENCOUNTER — Ambulatory Visit (HOSPITAL_COMMUNITY)
Admission: RE | Admit: 2021-07-16 | Discharge: 2021-07-16 | Disposition: A | Discharge status: Home / Self Care | Payer: PPO | Source: Ambulatory Visit | Attending: Neurology | Admitting: Neurology

## 2021-07-16 DIAGNOSIS — H47012 Ischemic optic neuropathy, left eye: Secondary | ICD-10-CM

## 2021-07-16 DIAGNOSIS — H53132 Sudden visual loss, left eye: Secondary | ICD-10-CM

## 2021-07-16 DIAGNOSIS — H4710 Unspecified papilledema: Secondary | ICD-10-CM | POA: Diagnosis not present

## 2021-07-16 DIAGNOSIS — H469 Unspecified optic neuritis: Secondary | ICD-10-CM | POA: Diagnosis not present

## 2021-07-16 DIAGNOSIS — I6523 Occlusion and stenosis of bilateral carotid arteries: Secondary | ICD-10-CM | POA: Diagnosis not present

## 2021-07-18 DIAGNOSIS — H47012 Ischemic optic neuropathy, left eye: Secondary | ICD-10-CM | POA: Diagnosis not present

## 2021-07-31 DIAGNOSIS — H47012 Ischemic optic neuropathy, left eye: Secondary | ICD-10-CM | POA: Diagnosis not present

## 2021-07-31 DIAGNOSIS — H348121 Central retinal vein occlusion, left eye, with retinal neovascularization: Secondary | ICD-10-CM | POA: Diagnosis not present

## 2021-07-31 DIAGNOSIS — H35342 Macular cyst, hole, or pseudohole, left eye: Secondary | ICD-10-CM | POA: Diagnosis not present

## 2021-07-31 DIAGNOSIS — H3412 Central retinal artery occlusion, left eye: Secondary | ICD-10-CM | POA: Diagnosis not present

## 2021-08-26 DIAGNOSIS — C61 Malignant neoplasm of prostate: Secondary | ICD-10-CM | POA: Diagnosis not present

## 2021-08-27 DIAGNOSIS — R5383 Other fatigue: Secondary | ICD-10-CM | POA: Diagnosis not present

## 2021-08-27 DIAGNOSIS — Z1329 Encounter for screening for other suspected endocrine disorder: Secondary | ICD-10-CM | POA: Diagnosis not present

## 2021-08-27 DIAGNOSIS — E7849 Other hyperlipidemia: Secondary | ICD-10-CM | POA: Diagnosis not present

## 2021-08-27 DIAGNOSIS — E782 Mixed hyperlipidemia: Secondary | ICD-10-CM | POA: Diagnosis not present

## 2021-08-27 DIAGNOSIS — R7303 Prediabetes: Secondary | ICD-10-CM | POA: Diagnosis not present

## 2021-08-27 DIAGNOSIS — R7301 Impaired fasting glucose: Secondary | ICD-10-CM | POA: Diagnosis not present

## 2021-09-03 DIAGNOSIS — Z0001 Encounter for general adult medical examination with abnormal findings: Secondary | ICD-10-CM | POA: Diagnosis not present

## 2021-09-03 DIAGNOSIS — H47019 Ischemic optic neuropathy, unspecified eye: Secondary | ICD-10-CM | POA: Diagnosis not present

## 2021-09-03 DIAGNOSIS — R7303 Prediabetes: Secondary | ICD-10-CM | POA: Diagnosis not present

## 2021-09-03 DIAGNOSIS — I700 Atherosclerosis of aorta: Secondary | ICD-10-CM | POA: Diagnosis not present

## 2021-09-03 DIAGNOSIS — C61 Malignant neoplasm of prostate: Secondary | ICD-10-CM | POA: Diagnosis not present

## 2021-09-03 DIAGNOSIS — K5730 Diverticulosis of large intestine without perforation or abscess without bleeding: Secondary | ICD-10-CM | POA: Diagnosis not present

## 2021-09-03 DIAGNOSIS — E7849 Other hyperlipidemia: Secondary | ICD-10-CM | POA: Diagnosis not present

## 2021-09-03 DIAGNOSIS — E559 Vitamin D deficiency, unspecified: Secondary | ICD-10-CM | POA: Diagnosis not present

## 2021-09-04 DIAGNOSIS — H4312 Vitreous hemorrhage, left eye: Secondary | ICD-10-CM | POA: Diagnosis not present

## 2021-09-04 DIAGNOSIS — H35342 Macular cyst, hole, or pseudohole, left eye: Secondary | ICD-10-CM | POA: Diagnosis not present

## 2021-09-04 DIAGNOSIS — H47012 Ischemic optic neuropathy, left eye: Secondary | ICD-10-CM | POA: Diagnosis not present

## 2021-09-04 DIAGNOSIS — H348121 Central retinal vein occlusion, left eye, with retinal neovascularization: Secondary | ICD-10-CM | POA: Diagnosis not present

## 2021-09-10 ENCOUNTER — Ambulatory Visit: Payer: PPO | Admitting: Neurology

## 2021-09-25 DIAGNOSIS — H348121 Central retinal vein occlusion, left eye, with retinal neovascularization: Secondary | ICD-10-CM | POA: Diagnosis not present

## 2021-11-18 DIAGNOSIS — H35032 Hypertensive retinopathy, left eye: Secondary | ICD-10-CM | POA: Diagnosis not present

## 2021-11-18 DIAGNOSIS — H524 Presbyopia: Secondary | ICD-10-CM | POA: Diagnosis not present

## 2021-12-03 DIAGNOSIS — J019 Acute sinusitis, unspecified: Secondary | ICD-10-CM | POA: Diagnosis not present

## 2021-12-03 DIAGNOSIS — Z20828 Contact with and (suspected) exposure to other viral communicable diseases: Secondary | ICD-10-CM | POA: Diagnosis not present

## 2021-12-03 DIAGNOSIS — R059 Cough, unspecified: Secondary | ICD-10-CM | POA: Diagnosis not present

## 2021-12-04 DIAGNOSIS — H35372 Puckering of macula, left eye: Secondary | ICD-10-CM | POA: Diagnosis not present

## 2021-12-04 DIAGNOSIS — H348121 Central retinal vein occlusion, left eye, with retinal neovascularization: Secondary | ICD-10-CM | POA: Diagnosis not present

## 2021-12-04 DIAGNOSIS — H35342 Macular cyst, hole, or pseudohole, left eye: Secondary | ICD-10-CM | POA: Diagnosis not present

## 2021-12-04 DIAGNOSIS — H3412 Central retinal artery occlusion, left eye: Secondary | ICD-10-CM | POA: Diagnosis not present

## 2021-12-06 DIAGNOSIS — K111 Hypertrophy of salivary gland: Secondary | ICD-10-CM | POA: Diagnosis not present

## 2021-12-06 DIAGNOSIS — J029 Acute pharyngitis, unspecified: Secondary | ICD-10-CM | POA: Diagnosis not present

## 2022-01-03 DIAGNOSIS — C61 Malignant neoplasm of prostate: Secondary | ICD-10-CM | POA: Diagnosis not present

## 2022-01-10 DIAGNOSIS — N5201 Erectile dysfunction due to arterial insufficiency: Secondary | ICD-10-CM | POA: Diagnosis not present

## 2022-01-10 DIAGNOSIS — R3915 Urgency of urination: Secondary | ICD-10-CM | POA: Diagnosis not present

## 2022-01-10 DIAGNOSIS — C61 Malignant neoplasm of prostate: Secondary | ICD-10-CM | POA: Diagnosis not present

## 2022-01-20 DIAGNOSIS — C61 Malignant neoplasm of prostate: Secondary | ICD-10-CM | POA: Diagnosis not present

## 2022-01-20 DIAGNOSIS — N5201 Erectile dysfunction due to arterial insufficiency: Secondary | ICD-10-CM | POA: Diagnosis not present

## 2022-01-21 DIAGNOSIS — D485 Neoplasm of uncertain behavior of skin: Secondary | ICD-10-CM | POA: Diagnosis not present

## 2022-01-21 DIAGNOSIS — D2261 Melanocytic nevi of right upper limb, including shoulder: Secondary | ICD-10-CM | POA: Diagnosis not present

## 2022-01-21 DIAGNOSIS — L739 Follicular disorder, unspecified: Secondary | ICD-10-CM | POA: Diagnosis not present

## 2022-01-21 DIAGNOSIS — L820 Inflamed seborrheic keratosis: Secondary | ICD-10-CM | POA: Diagnosis not present

## 2022-01-21 DIAGNOSIS — L570 Actinic keratosis: Secondary | ICD-10-CM | POA: Diagnosis not present

## 2022-01-24 ENCOUNTER — Other Ambulatory Visit: Payer: Self-pay | Admitting: Urology

## 2022-02-06 DIAGNOSIS — D485 Neoplasm of uncertain behavior of skin: Secondary | ICD-10-CM | POA: Diagnosis not present

## 2022-02-06 DIAGNOSIS — L905 Scar conditions and fibrosis of skin: Secondary | ICD-10-CM | POA: Diagnosis not present

## 2022-02-17 DIAGNOSIS — E782 Mixed hyperlipidemia: Secondary | ICD-10-CM | POA: Diagnosis not present

## 2022-02-17 DIAGNOSIS — R5383 Other fatigue: Secondary | ICD-10-CM | POA: Diagnosis not present

## 2022-02-17 DIAGNOSIS — Z1159 Encounter for screening for other viral diseases: Secondary | ICD-10-CM | POA: Diagnosis not present

## 2022-02-17 DIAGNOSIS — R972 Elevated prostate specific antigen [PSA]: Secondary | ICD-10-CM | POA: Diagnosis not present

## 2022-02-17 DIAGNOSIS — R7303 Prediabetes: Secondary | ICD-10-CM | POA: Diagnosis not present

## 2022-02-17 DIAGNOSIS — Z1329 Encounter for screening for other suspected endocrine disorder: Secondary | ICD-10-CM | POA: Diagnosis not present

## 2022-02-17 DIAGNOSIS — E7849 Other hyperlipidemia: Secondary | ICD-10-CM | POA: Diagnosis not present

## 2022-02-24 DIAGNOSIS — I700 Atherosclerosis of aorta: Secondary | ICD-10-CM | POA: Diagnosis not present

## 2022-02-24 DIAGNOSIS — Z1389 Encounter for screening for other disorder: Secondary | ICD-10-CM | POA: Diagnosis not present

## 2022-02-24 DIAGNOSIS — H47019 Ischemic optic neuropathy, unspecified eye: Secondary | ICD-10-CM | POA: Diagnosis not present

## 2022-02-24 DIAGNOSIS — H8113 Benign paroxysmal vertigo, bilateral: Secondary | ICD-10-CM | POA: Diagnosis not present

## 2022-02-24 DIAGNOSIS — I10 Essential (primary) hypertension: Secondary | ICD-10-CM | POA: Diagnosis not present

## 2022-02-24 DIAGNOSIS — F959 Tic disorder, unspecified: Secondary | ICD-10-CM | POA: Diagnosis not present

## 2022-02-24 DIAGNOSIS — R7303 Prediabetes: Secondary | ICD-10-CM | POA: Diagnosis not present

## 2022-02-24 DIAGNOSIS — Z1331 Encounter for screening for depression: Secondary | ICD-10-CM | POA: Diagnosis not present

## 2022-02-25 DIAGNOSIS — E7849 Other hyperlipidemia: Secondary | ICD-10-CM | POA: Diagnosis not present

## 2022-02-25 DIAGNOSIS — D519 Vitamin B12 deficiency anemia, unspecified: Secondary | ICD-10-CM | POA: Diagnosis not present

## 2022-02-25 DIAGNOSIS — I700 Atherosclerosis of aorta: Secondary | ICD-10-CM | POA: Diagnosis not present

## 2022-02-25 DIAGNOSIS — Z6830 Body mass index (BMI) 30.0-30.9, adult: Secondary | ICD-10-CM | POA: Diagnosis not present

## 2022-02-25 DIAGNOSIS — F17210 Nicotine dependence, cigarettes, uncomplicated: Secondary | ICD-10-CM | POA: Diagnosis not present

## 2022-02-25 DIAGNOSIS — Z0001 Encounter for general adult medical examination with abnormal findings: Secondary | ICD-10-CM | POA: Diagnosis not present

## 2022-02-25 DIAGNOSIS — I10 Essential (primary) hypertension: Secondary | ICD-10-CM | POA: Diagnosis not present

## 2022-03-04 DIAGNOSIS — Z01818 Encounter for other preprocedural examination: Secondary | ICD-10-CM | POA: Diagnosis not present

## 2022-03-04 DIAGNOSIS — Z01810 Encounter for preprocedural cardiovascular examination: Secondary | ICD-10-CM | POA: Diagnosis not present

## 2022-03-04 DIAGNOSIS — I2510 Atherosclerotic heart disease of native coronary artery without angina pectoris: Secondary | ICD-10-CM | POA: Diagnosis not present

## 2022-03-05 DIAGNOSIS — I10 Essential (primary) hypertension: Secondary | ICD-10-CM | POA: Diagnosis not present

## 2022-03-05 DIAGNOSIS — R7303 Prediabetes: Secondary | ICD-10-CM | POA: Diagnosis not present

## 2022-03-05 DIAGNOSIS — H8113 Benign paroxysmal vertigo, bilateral: Secondary | ICD-10-CM | POA: Diagnosis not present

## 2022-03-05 DIAGNOSIS — C61 Malignant neoplasm of prostate: Secondary | ICD-10-CM | POA: Diagnosis not present

## 2022-03-05 DIAGNOSIS — E7849 Other hyperlipidemia: Secondary | ICD-10-CM | POA: Diagnosis not present

## 2022-03-05 DIAGNOSIS — H47019 Ischemic optic neuropathy, unspecified eye: Secondary | ICD-10-CM | POA: Diagnosis not present

## 2022-03-05 DIAGNOSIS — F959 Tic disorder, unspecified: Secondary | ICD-10-CM | POA: Diagnosis not present

## 2022-03-05 DIAGNOSIS — I700 Atherosclerosis of aorta: Secondary | ICD-10-CM | POA: Diagnosis not present

## 2022-03-06 ENCOUNTER — Encounter (HOSPITAL_BASED_OUTPATIENT_CLINIC_OR_DEPARTMENT_OTHER): Payer: Self-pay | Admitting: Urology

## 2022-03-06 ENCOUNTER — Other Ambulatory Visit: Payer: Self-pay

## 2022-03-06 NOTE — Progress Notes (Addendum)
ADDENDUM:  received pt's medication list form pt's pcp office , with chart), updated med list in epic.  Called and spoke w/ pt verified the mediation's and when he takes them.  Pt verbalized understanding to take norvasc, lipitor, clonidine morning of surgery. ? ? ?Spoke w/ via phone for pre-op interview--- pt ?Lab needs dos----  [employer] and ekg             ?Lab results------ no ?COVID test -----patient states asymptomatic no test needed ?Arrive at ------- 1030 on 03-11-2022 ?NPO after MN NO Solid Food.  Clear liquids from MN until--- 0930 ? ?Med rec completed-- not completed due to pt did have his list with him, did not remember bp meds ? ?Medications to take morning of surgery ---- ? Pt will need call back after receiving med list from his pcp with what meds to take morning of surgery. ?Diabetic medication ----- n/a ? ?Patient instructed to bring photo id and insurance card day of surgery ?Patient aware to have Driver (ride ) / caregiver  for 24 hours after surgery -- wife, Gregory Middleton ? ?Patient Special Instructions ----- pt to do hibiclens shower night before surgery (to get from any pharmacy) . Reviewed [facility] and visitor guidelines. Asked to bring nasal spray and eye drops. ?Pre-Op special Istructions ----- called / left message requesting pt's pcp clearance from Pam, OR scheduler for Gregory Cain Sieve.  Received call back when Pam receive's it she will fax. ? ?Patient verbalized understanding of instructions that were given at this phone interview. ?Patient denies shortness of breath, chest pain, fever, cough at this phone interview.  ? ? 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 ? ?PCP: Gregory Middleton (lov per pt today 03-06-2022) ?Echo:  03-26-2015 epic ?Stress test: nuclear 03-04-2022  care everywhere ? ?ASA / Instructions/ Last Dose :   ASA '81mg'$ / pt stated given instructions from Gregory Cain Sieve office to stop week prior to surgery, stated last dose 02-24-2022 ?

## 2022-03-11 ENCOUNTER — Other Ambulatory Visit: Payer: Self-pay

## 2022-03-11 ENCOUNTER — Observation Stay (HOSPITAL_BASED_OUTPATIENT_CLINIC_OR_DEPARTMENT_OTHER)
Admission: RE | Admit: 2022-03-11 | Discharge: 2022-03-12 | Disposition: A | Discharge status: Home / Self Care | Payer: PPO | Attending: Urology | Admitting: Urology

## 2022-03-11 ENCOUNTER — Ambulatory Visit (HOSPITAL_BASED_OUTPATIENT_CLINIC_OR_DEPARTMENT_OTHER): Payer: PPO | Admitting: Anesthesiology

## 2022-03-11 ENCOUNTER — Encounter (HOSPITAL_BASED_OUTPATIENT_CLINIC_OR_DEPARTMENT_OTHER): Payer: Self-pay | Admitting: Urology

## 2022-03-11 ENCOUNTER — Encounter (HOSPITAL_BASED_OUTPATIENT_CLINIC_OR_DEPARTMENT_OTHER)
Admission: RE | Disposition: A | Discharge status: Home / Self Care | Payer: Self-pay | Source: Home / Self Care | Attending: Urology

## 2022-03-11 DIAGNOSIS — N529 Male erectile dysfunction, unspecified: Principal | ICD-10-CM | POA: Insufficient documentation

## 2022-03-11 DIAGNOSIS — Z79899 Other long term (current) drug therapy: Secondary | ICD-10-CM | POA: Insufficient documentation

## 2022-03-11 DIAGNOSIS — G8918 Other acute postprocedural pain: Secondary | ICD-10-CM | POA: Diagnosis present

## 2022-03-11 DIAGNOSIS — R7303 Prediabetes: Secondary | ICD-10-CM | POA: Diagnosis not present

## 2022-03-11 DIAGNOSIS — Z8616 Personal history of COVID-19: Secondary | ICD-10-CM | POA: Insufficient documentation

## 2022-03-11 DIAGNOSIS — Z8546 Personal history of malignant neoplasm of prostate: Secondary | ICD-10-CM | POA: Insufficient documentation

## 2022-03-11 HISTORY — PX: PENILE PROSTHESIS IMPLANT: SHX240

## 2022-03-11 HISTORY — DX: Diverticulosis of large intestine without perforation or abscess without bleeding: K57.30

## 2022-03-11 HISTORY — DX: Personal history of other diseases of the circulatory system: Z86.79

## 2022-03-11 HISTORY — DX: Chronic cough: R05.3

## 2022-03-11 HISTORY — DX: Unspecified symptoms and signs involving the genitourinary system: R39.9

## 2022-03-11 HISTORY — DX: Unspecified osteoarthritis, unspecified site: M19.90

## 2022-03-11 HISTORY — DX: Chronic rhinitis: J31.0

## 2022-03-11 HISTORY — DX: Atherosclerotic heart disease of native coronary artery without angina pectoris: I25.10

## 2022-03-11 HISTORY — DX: Personal history of other specified conditions: Z87.898

## 2022-03-11 HISTORY — DX: Presence of spectacles and contact lenses: Z97.3

## 2022-03-11 HISTORY — DX: Personal history of irradiation: Z92.3

## 2022-03-11 HISTORY — DX: Personal history of other diseases of the digestive system: Z87.19

## 2022-03-11 LAB — POCT I-STAT, CHEM 8
BUN: 15 mg/dL (ref 8–23)
Calcium, Ion: 1.28 mmol/L (ref 1.15–1.40)
Chloride: 103 mmol/L (ref 98–111)
Creatinine, Ser: 0.7 mg/dL (ref 0.61–1.24)
Glucose, Bld: 119 mg/dL — ABNORMAL HIGH (ref 70–99)
HCT: 39 % (ref 39.0–52.0)
Hemoglobin: 13.3 g/dL (ref 13.0–17.0)
Potassium: 4.1 mmol/L (ref 3.5–5.1)
Sodium: 140 mmol/L (ref 135–145)
TCO2: 28 mmol/L (ref 22–32)

## 2022-03-11 SURGERY — INSERTION, PENILE PROSTHESIS, INFLATABLE
Anesthesia: General | Site: Scrotum

## 2022-03-11 MED ORDER — FENTANYL CITRATE (PF) 250 MCG/5ML IJ SOLN
INTRAMUSCULAR | Status: AC
  Filled 2022-03-11: qty 5

## 2022-03-11 MED ORDER — MELATONIN 3 MG PO TABS
3.0000 mg | ORAL_TABLET | Freq: Every day | ORAL | Status: DC
Start: 2022-03-11 — End: 2022-03-11
  Filled 2022-03-11: qty 1

## 2022-03-11 MED ORDER — DIPHENHYDRAMINE HCL 25 MG PO CAPS
ORAL_CAPSULE | ORAL | Status: AC
  Filled 2022-03-11: qty 2

## 2022-03-11 MED ORDER — OXYCODONE HCL 5 MG/5ML PO SOLN: 5.0000 mg | Freq: Once | ORAL | Status: DC | PRN

## 2022-03-11 MED ORDER — LIDOCAINE HCL (PF) 1 % IJ SOLN
INTRAMUSCULAR | Status: DC | PRN
  Administered 2022-03-11: 20 mL

## 2022-03-11 MED ORDER — KETOROLAC TROMETHAMINE 30 MG/ML IJ SOLN
INTRAMUSCULAR | Status: DC | PRN
  Administered 2022-03-11: 30 mg via INTRAVENOUS

## 2022-03-11 MED ORDER — KETOROLAC TROMETHAMINE 30 MG/ML IJ SOLN
INTRAMUSCULAR | Status: AC
  Filled 2022-03-11: qty 1

## 2022-03-11 MED ORDER — CELECOXIB 200 MG PO CAPS
ORAL_CAPSULE | ORAL | Status: AC
  Filled 2022-03-11: qty 1

## 2022-03-11 MED ORDER — SODIUM CHLORIDE 0.9 % IR SOLN
Status: DC | PRN
  Administered 2022-03-11: 500 mL

## 2022-03-11 MED ORDER — DIPHENHYDRAMINE HCL 25 MG PO CAPS
25.0000 mg | ORAL_CAPSULE | Freq: Every evening | ORAL | Status: DC | PRN
  Administered 2022-03-11: 50 mg via ORAL

## 2022-03-11 MED ORDER — LORATADINE 10 MG PO TABS
10.0000 mg | ORAL_TABLET | Freq: Every day | ORAL | Status: DC
Start: 2022-03-11 — End: 2022-03-12

## 2022-03-11 MED ORDER — GENTAMICIN SULFATE 40 MG/ML IJ SOLN
5.0000 mg/kg | INTRAVENOUS | Status: DC
  Filled 2022-03-11: qty 9.5

## 2022-03-11 MED ORDER — ACETAMINOPHEN 500 MG PO TABS
ORAL_TABLET | ORAL | Status: AC
  Filled 2022-03-11: qty 2

## 2022-03-11 MED ORDER — LIDOCAINE HCL (CARDIAC) PF 100 MG/5ML IV SOSY
PREFILLED_SYRINGE | INTRAVENOUS | Status: DC | PRN
  Administered 2022-03-11: 50 mg via INTRAVENOUS

## 2022-03-11 MED ORDER — DEXAMETHASONE SODIUM PHOSPHATE 4 MG/ML IJ SOLN
INTRAMUSCULAR | Status: DC | PRN
  Administered 2022-03-11: 5 mg via INTRAVENOUS

## 2022-03-11 MED ORDER — OXYCODONE HCL 5 MG PO TABS: 5.0000 mg | ORAL_TABLET | Freq: Once | ORAL | Status: DC | PRN

## 2022-03-11 MED ORDER — CHLORHEXIDINE GLUCONATE 4 % EX LIQD: Freq: Once | CUTANEOUS | Status: DC

## 2022-03-11 MED ORDER — AMLODIPINE BESYLATE 10 MG PO TABS
10.0000 mg | ORAL_TABLET | Freq: Every day | ORAL | Status: DC
  Filled 2022-03-11: qty 1

## 2022-03-11 MED ORDER — FENTANYL CITRATE (PF) 100 MCG/2ML IJ SOLN: 25.0000 ug | INTRAMUSCULAR | Status: DC | PRN

## 2022-03-11 MED ORDER — VITAMIN D3 25 MCG (1000 UNIT) PO TABS
1000.0000 [IU] | ORAL_TABLET | Freq: Every day | ORAL | Status: DC
  Filled 2022-03-11: qty 1

## 2022-03-11 MED ORDER — VANCOMYCIN HCL IN DEXTROSE 1-5 GM/200ML-% IV SOLN
INTRAVENOUS | Status: AC
  Filled 2022-03-11: qty 200

## 2022-03-11 MED ORDER — ATORVASTATIN CALCIUM 20 MG PO TABS
20.0000 mg | ORAL_TABLET | Freq: Every day | ORAL | Status: DC
  Filled 2022-03-11: qty 1

## 2022-03-11 MED ORDER — ACETAMINOPHEN 500 MG PO TABS
1000.0000 mg | ORAL_TABLET | Freq: Once | ORAL | Status: AC
Start: 2022-03-11 — End: 2022-03-11
  Administered 2022-03-11: 1000 mg via ORAL

## 2022-03-11 MED ORDER — FENTANYL CITRATE (PF) 100 MCG/2ML IJ SOLN
INTRAMUSCULAR | Status: DC | PRN
  Administered 2022-03-11 (×3): 25 ug via INTRAVENOUS
  Administered 2022-03-11: 50 ug via INTRAVENOUS
  Administered 2022-03-11 (×2): 25 ug via INTRAVENOUS

## 2022-03-11 MED ORDER — SODIUM CHLORIDE 0.9 % IV SOLN: INTRAVENOUS | Status: DC

## 2022-03-11 MED ORDER — HYDROCODONE-ACETAMINOPHEN 10-325 MG PO TABS: 1.0000 | ORAL_TABLET | ORAL | Status: DC | PRN

## 2022-03-11 MED ORDER — ONDANSETRON HCL 4 MG/2ML IJ SOLN
4.0000 mg | Freq: Four times a day (QID) | INTRAMUSCULAR | Status: DC | PRN
Start: 2022-03-11 — End: 2022-03-12

## 2022-03-11 MED ORDER — ONDANSETRON HCL 4 MG/2ML IJ SOLN
INTRAMUSCULAR | Status: DC | PRN
Start: 2022-03-11 — End: 2022-03-11
  Administered 2022-03-11: 4 mg via INTRAVENOUS

## 2022-03-11 MED ORDER — FLUTICASONE PROPIONATE 50 MCG/ACT NA SUSP
2.0000 | Freq: Every day | NASAL | Status: DC
  Filled 2022-03-11: qty 16

## 2022-03-11 MED ORDER — VANCOMYCIN HCL 1500 MG/300ML IV SOLN
1500.0000 mg | INTRAVENOUS | Status: AC
  Administered 2022-03-11 (×2): 1500 mg via INTRAVENOUS
  Filled 2022-03-11: qty 300

## 2022-03-11 MED ORDER — VANCOMYCIN HCL IN DEXTROSE 1-5 GM/200ML-% IV SOLN
1000.0000 mg | Freq: Two times a day (BID) | INTRAVENOUS | Status: DC
  Administered 2022-03-11: 1000 mg via INTRAVENOUS

## 2022-03-11 MED ORDER — IRRISEPT - 450ML BOTTLE WITH 0.05% CHG IN STERILE WATER, USP 99.95% OPTIME
TOPICAL | Status: DC | PRN
  Administered 2022-03-11: 450 mL

## 2022-03-11 MED ORDER — EPHEDRINE SULFATE (PRESSORS) 50 MG/ML IJ SOLN
INTRAMUSCULAR | Status: DC | PRN
  Administered 2022-03-11 (×8): 5 mg via INTRAVENOUS

## 2022-03-11 MED ORDER — BENZONATATE 100 MG PO CAPS
100.0000 mg | ORAL_CAPSULE | Freq: Three times a day (TID) | ORAL | Status: DC | PRN
  Filled 2022-03-11: qty 1

## 2022-03-11 MED ORDER — PHENYLEPHRINE 40 MCG/ML (10ML) SYRINGE FOR IV PUSH (FOR BLOOD PRESSURE SUPPORT)
PREFILLED_SYRINGE | INTRAVENOUS | Status: AC
  Filled 2022-03-11: qty 20

## 2022-03-11 MED ORDER — PROPOFOL 10 MG/ML IV BOLUS
INTRAVENOUS | Status: DC | PRN
  Administered 2022-03-11: 140 mg via INTRAVENOUS

## 2022-03-11 MED ORDER — MIDAZOLAM HCL 2 MG/2ML IJ SOLN
INTRAMUSCULAR | Status: DC | PRN
  Administered 2022-03-11: 1 mg via INTRAVENOUS

## 2022-03-11 MED ORDER — GENTAMICIN SULFATE 40 MG/ML IJ SOLN
5.0000 mg/kg | INTRAVENOUS | Status: AC
  Administered 2022-03-11: 380 mg via INTRAVENOUS
  Filled 2022-03-11: qty 9.5

## 2022-03-11 MED ORDER — CELECOXIB 200 MG PO CAPS
200.0000 mg | ORAL_CAPSULE | Freq: Two times a day (BID) | ORAL | Status: DC
  Administered 2022-03-11: 200 mg via ORAL

## 2022-03-11 MED ORDER — PHENYLEPHRINE 40 MCG/ML (10ML) SYRINGE FOR IV PUSH (FOR BLOOD PRESSURE SUPPORT)
PREFILLED_SYRINGE | INTRAVENOUS | Status: AC
  Filled 2022-03-11: qty 10

## 2022-03-11 MED ORDER — HYDROCODONE-ACETAMINOPHEN 5-325 MG PO TABS
1.0000 | ORAL_TABLET | ORAL | Status: DC | PRN
  Administered 2022-03-12: 2 via ORAL
  Administered 2022-03-12: 1 via ORAL

## 2022-03-11 MED ORDER — GUAIFENESIN ER 600 MG PO TB12
600.0000 mg | ORAL_TABLET | Freq: Every day | ORAL | Status: DC
Start: 2022-03-11 — End: 2022-03-12
  Filled 2022-03-11: qty 1

## 2022-03-11 MED ORDER — EPHEDRINE 5 MG/ML INJ
INTRAVENOUS | Status: AC
  Filled 2022-03-11: qty 15

## 2022-03-11 MED ORDER — ONDANSETRON 4 MG PO TBDP: 4.0000 mg | ORAL_TABLET | Freq: Four times a day (QID) | ORAL | Status: DC | PRN

## 2022-03-11 MED ORDER — LISINOPRIL 20 MG PO TABS
30.0000 mg | ORAL_TABLET | Freq: Every day | ORAL | Status: DC
  Administered 2022-03-11: 30 mg via ORAL
  Filled 2022-03-11: qty 1

## 2022-03-11 MED ORDER — PHENYLEPHRINE HCL (PRESSORS) 10 MG/ML IV SOLN
INTRAVENOUS | Status: DC | PRN
  Administered 2022-03-11: 120 ug via INTRAVENOUS
  Administered 2022-03-11 (×4): 80 ug via INTRAVENOUS
  Administered 2022-03-11: 120 ug via INTRAVENOUS
  Administered 2022-03-11: 40 ug via INTRAVENOUS
  Administered 2022-03-11 (×2): 80 ug via INTRAVENOUS

## 2022-03-11 MED ORDER — HYDROMORPHONE HCL 1 MG/ML IJ SOLN: 0.5000 mg | INTRAMUSCULAR | Status: DC | PRN

## 2022-03-11 MED ORDER — MELATONIN 5 MG PO TABS
5.0000 mg | ORAL_TABLET | Freq: Every day | ORAL | Status: DC
  Administered 2022-03-11: 5 mg via ORAL
  Filled 2022-03-11: qty 1

## 2022-03-11 MED ORDER — PROPYLENE GLYCOL 0.6 % OP SOLN: 1.0000 [drp] | Freq: Three times a day (TID) | OPHTHALMIC | Status: DC

## 2022-03-11 MED ORDER — MIDAZOLAM HCL 2 MG/2ML IJ SOLN
INTRAMUSCULAR | Status: AC
  Filled 2022-03-11: qty 2

## 2022-03-11 MED ORDER — LACTATED RINGERS IV SOLN: INTRAVENOUS | Status: DC

## 2022-03-11 SURGICAL SUPPLY — 67 items
ADH SKN CLS APL DERMABOND .7 (GAUZE/BANDAGES/DRESSINGS) ×1
APL PRP STRL LF DISP 70% ISPRP (MISCELLANEOUS) ×2
BAG DECANTER FOR FLEXI CONT (MISCELLANEOUS) ×1 IMPLANT
BAG DRN RND TRDRP ANRFLXCHMBR (UROLOGICAL SUPPLIES)
BAG URINE DRAIN 2000ML AR STRL (UROLOGICAL SUPPLIES) ×1 IMPLANT
BLADE SURG 15 STRL LF DISP TIS (BLADE) ×1 IMPLANT
BLADE SURG 15 STRL SS (BLADE) ×2
BNDG COHESIVE 2X5 TAN ST LF (GAUZE/BANDAGES/DRESSINGS) IMPLANT
BNDG GAUZE ELAST 4 BULKY (GAUZE/BANDAGES/DRESSINGS) ×2 IMPLANT
Boston Scientific AMS700 Accessory Kit ×1 IMPLANT
CATH COUDE FOLEY 2W 5CC 16FR (CATHETERS) ×1 IMPLANT
CATH FOLEY 2WAY SLVR  5CC 16FR (CATHETERS)
CATH FOLEY 2WAY SLVR 5CC 16FR (CATHETERS) ×1 IMPLANT
CHLORAPREP W/TINT 26 (MISCELLANEOUS) ×3 IMPLANT
COVER BACK TABLE 60X90IN (DRAPES) ×2 IMPLANT
COVER MAYO STAND STRL (DRAPES) ×4 IMPLANT
DERMABOND ADVANCED (GAUZE/BANDAGES/DRESSINGS) ×1
DERMABOND ADVANCED .7 DNX12 (GAUZE/BANDAGES/DRESSINGS) ×1 IMPLANT
DRAIN CHANNEL 10F 3/8 F FF (DRAIN) ×2 IMPLANT
DRAPE INCISE IOBAN 66X45 STRL (DRAPES) ×2 IMPLANT
DRAPE LAPAROTOMY 100X72 PEDS (DRAPES) ×2 IMPLANT
ELECT NDL BLADE 2-5/6 (NEEDLE) IMPLANT
ELECT NEEDLE BLADE 2-5/6 (NEEDLE) ×2 IMPLANT
EVACUATOR DRAINAGE 7X20 100CC (MISCELLANEOUS) ×1 IMPLANT
EVACUATOR SILICONE 100CC (DRAIN) ×2 IMPLANT
EVACUATOR SILICONE 100CC (MISCELLANEOUS) ×2
GAUZE 4X4 16PLY ~~LOC~~+RFID DBL (SPONGE) ×2 IMPLANT
GAUZE SPONGE 4X4 12PLY STRL (GAUZE/BANDAGES/DRESSINGS) ×2 IMPLANT
GAUZE SPONGE 4X4 12PLY STRL LF (GAUZE/BANDAGES/DRESSINGS) ×1 IMPLANT
GLOVE SURG ENC MOIS LTX SZ7.5 (GLOVE) ×6 IMPLANT
GOWN STRL REUS W/TWL XL LVL3 (GOWN DISPOSABLE) ×2 IMPLANT
HOLDER FOLEY CATH W/STRAP (MISCELLANEOUS) ×2 IMPLANT
HOOD PEEL AWAY FLYTE STAYCOOL (MISCELLANEOUS) ×1 IMPLANT
IMPL RTE SNAPCONE CX 3.0 (Breast) IMPLANT
IMPLANT RTE SNAPCONE CX 3.0 (Breast) ×2 IMPLANT
JET LAVAGE IRRISEPT WOUND (IRRIGATION / IRRIGATOR) ×2
KIT ACCESSORY AMS 700 PUMP (UROLOGICAL SUPPLIES) ×1 IMPLANT
KIT TURNOVER CYSTO (KITS) ×2 IMPLANT
LAVAGE JET IRRISEPT WOUND (IRRIGATION / IRRIGATOR) ×1 IMPLANT
NEEDLE HYPO 22GX1.5 SAFETY (NEEDLE) ×1 IMPLANT
NS IRRIG 1000ML POUR BTL (IV SOLUTION) ×1 IMPLANT
NS IRRIG 500ML POUR BTL (IV SOLUTION) ×1 IMPLANT
PACK BASIN DAY SURGERY FS (CUSTOM PROCEDURE TRAY) ×2 IMPLANT
PENCIL SMOKE EVACUATOR (MISCELLANEOUS) ×2 IMPLANT
PLUG CATH AND CAP STER (CATHETERS) ×2 IMPLANT
PROS PENILE CX PRECON 18CM (Miscellaneous) ×1 IMPLANT
RESERVOIR 65ML PENILE PROS (Urological Implant) ×1 IMPLANT
RETRACTOR DEEP SCROTAL PENILE (MISCELLANEOUS) ×1 IMPLANT
RETRACTOR WILSON SYSTEM (INSTRUMENTS) IMPLANT
SUPPORT SCROTAL LG STRP (MISCELLANEOUS) ×1 IMPLANT
SUT MNCRL AB 3-0 PS2 18 (SUTURE) ×3 IMPLANT
SUT MNCRL AB 4-0 PS2 18 (SUTURE) ×1 IMPLANT
SUT VIC AB 2-0 SH 27 (SUTURE) ×2
SUT VIC AB 2-0 SH 27XBRD (SUTURE) ×5 IMPLANT
SUT VIC AB 2-0 UR6 27 (SUTURE) ×6 IMPLANT
SUT VIC AB 4-0 PS2 18 (SUTURE) ×1 IMPLANT
SYR 10ML LL (SYRINGE) ×4 IMPLANT
SYR 20ML LL LF (SYRINGE) ×1 IMPLANT
SYR 50ML LL SCALE MARK (SYRINGE) ×5 IMPLANT
SYR BULB IRRIG 60ML STRL (SYRINGE) ×2 IMPLANT
SYR CONTROL 10ML LL (SYRINGE) ×1 IMPLANT
TOWEL OR 17X26 10 PK STRL BLUE (TOWEL DISPOSABLE) ×2 IMPLANT
TRAY DSU PREP LF (CUSTOM PROCEDURE TRAY) ×2 IMPLANT
TRAY FOLEY SLVR 16FR LF STAT (SET/KITS/TRAYS/PACK) ×1 IMPLANT
TUBE CONNECTING 12X1/4 (SUCTIONS) ×2 IMPLANT
WATER STERILE IRR 1000ML POUR (IV SOLUTION) ×1 IMPLANT
YANKAUER SUCT BULB TIP NO VENT (SUCTIONS) ×4 IMPLANT

## 2022-03-11 NOTE — Anesthesia Procedure Notes (Addendum)
Procedure Name: LMA Insertion ?Date/Time: 03/11/2022 1:03 PM ?Performed by: Georgeanne Nim, CRNA ?Pre-anesthesia Checklist: Patient identified, Emergency Drugs available, Suction available, Patient being monitored and Timeout performed ?Patient Re-evaluated:Patient Re-evaluated prior to induction ?Oxygen Delivery Method: Circle system utilized ?Preoxygenation: Pre-oxygenation with 100% oxygen ?Induction Type: IV induction ?Ventilation: Mask ventilation without difficulty ?LMA: LMA inserted ?LMA Size: 4.0 ?Number of attempts: 1 ?Placement Confirmation: positive ETCO2, CO2 detector and breath sounds checked- equal and bilateral ?Tube secured with: Tape ?Dental Injury: Teeth and Oropharynx as per pre-operative assessment  ? ? ? ? ?

## 2022-03-11 NOTE — Anesthesia Postprocedure Evaluation (Signed)
Anesthesia Post Note ? ?Patient: TIMARION AGCAOILI ? ?Procedure(s) Performed: [place] ([place]) ? ?  ? ?Patient location during evaluation: PACU ?Anesthesia Type: General ?Level of consciousness: awake and alert ?Pain management: pain level controlled ?Vital Signs Assessment: post-procedure vital signs reviewed and stable ?Respiratory status: spontaneous breathing, nonlabored ventilation, respiratory function stable and patient connected to nasal cannula oxygen ?Cardiovascular status: blood pressure returned to baseline and stable ?Postop Assessment: no apparent nausea or vomiting ?Anesthetic complications: no ? ? ?No notable events documented. ? ?Last Vitals:  ?Vitals:  ? 03/11/22 1559 03/11/22 1600  ?BP: 117/65 (P) 114/64  ?Pulse: 78 (P) 72  ?Resp: 12 (P) 14  ?Temp:  (!) (P) 36.4 ?C  ?SpO2: 95% (P) 100%  ?  ?Last Pain:  ?Vitals:  ? 03/11/22 1600  ?TempSrc:   ?PainSc: (P) 2   ? ? ?  ?  ?  ?  ?  ?  ? ?Armanii Urbanik S ? ? ? ? ?

## 2022-03-11 NOTE — Anesthesia Preprocedure Evaluation (Addendum)
Anesthesia Evaluation  ?Patient identified by MRN, date of birth, ID band ?Patient awake ? ? ? ?Reviewed: ?Allergy & Precautions, NPO status , Patient's Chart, lab work & pertinent test results ? ?History of Anesthesia Complications ?Negative for: history of anesthetic complications ? ?Airway ?Mallampati: III ? ?TM Distance: >3 FB ?Neck ROM: Full ? ? ? Dental ? ?(+) Teeth Intact, Dental Advisory Given ?  ?Pulmonary ?neg pulmonary ROS,  ?  ?breath sounds clear to auscultation ? ? ? ? ? ? Cardiovascular ?(-) hypertension(-) angina+ CAD and + Peripheral Vascular Disease  ?(-) Past MI  ?Rhythm:Regular  ?Neg stress 3/23 ?  ?Neuro/Psych ?negative neurological ROS ? negative psych ROS  ? GI/Hepatic ?negative GI ROS, Neg liver ROS,   ?Endo/Other  ?negative endocrine ROSLab Results ?     Component                Value               Date                 ?     HGBA1C                   5.9 (H)             07/17/2016           ? ? Renal/GU ?negative Renal ROS  ? ?  ?Musculoskeletal ? ?(+) Arthritis ,  ? Abdominal ?  ?Peds ? Hematology ?negative hematology ROS ?(+)   ?Anesthesia Other Findings ? ? Reproductive/Obstetrics ? ?  ? ? ? ? ? ? ? ? ? ? ? ? ? ?  ?  ? ? ? ? ? ? ? ?Anesthesia Physical ?Anesthesia Plan ? ?ASA: 2 ? ?Anesthesia Plan: General  ? ?Post-op Pain Management: Toradol IV (intra-op)* and Tylenol PO (pre-op)*  ? ?Induction: Intravenous ? ?PONV Risk Score and Plan: 2 and Ondansetron and Dexamethasone ? ?Airway Management Planned: LMA and Oral ETT ? ?Additional Equipment: None ? ?Intra-op Plan:  ? ?Post-operative Plan: Extubation in OR ? ?Informed Consent: I have reviewed the patients History and Physical, chart, labs and discussed the procedure including the risks, benefits and alternatives for the proposed anesthesia with the patient or authorized representative who has indicated his/her understanding and acceptance.  ? ? ? ?Dental advisory given ? ?Plan Discussed with: CRNA and  Anesthesiologist ? ?Anesthesia Plan Comments:   ? ? ? ? ? ? ?Anesthesia Quick Evaluation ? ?

## 2022-03-11 NOTE — Progress Notes (Signed)
Dr Cain Sieve at bedside to evaluate patient. ?

## 2022-03-11 NOTE — H&P (Signed)
H&P ? ?History of Present Illness: Gregory Middleton is a 71 y.o. year old who presents today for insertion of an inflateable penile prosthesis. He has no acute complaints today ? ?Past Medical History:  ?Diagnosis Date  ? Arthritis   ? Chronic cough   ? per pt due to allergies  ? Chronic rhinitis   ? Coronary atherosclerosis   ? followed by pcp;   nuclear stress test 03-04-2022 result in care everywhere,  normal without ischemia,  nuclear ef 56%  ? Diverticulosis of colon   ? ED (erectile dysfunction)   ? History of COVID-19 10/2019  ? per pt mild symptoms that resovled  ? History of external beam radiation therapy   ? 12-17-2020  to 02-07-2021  to prostate fossa and pelvic lymph nodes  for recurrent prostate cancer  ? History of gastroesophageal reflux (GERD)   ? History of kidney stones 1996  ? History of palpitations in adulthood   ? many yrs ago evaluation by dr peter Martinique, event montior showed PACs/ PVCs  ? History of syncope   ? (03-06-2022  pt stated last episode approx 2013)  hx recurrent syncopal episodes , cardiac evaluation few times in the past last seen by dr Caryl Comes 2016 had normal stress test / echo,  yrs ago had montior that showed PACs/ PVCs heart related issues ruled  ? Hyperlipidemia   ? Lower urinary tract symptoms (LUTS)   ? Malignant neoplasm prostate ([place]) 03/2016  ? urologist-- Cain Sieve;  first dx 04/ 2017, Gleason 9, PSA 6.99;   07-28-2016  s/p radical prostatectomy , Stage III;   biochemical recurrence 12/ 2020 increasing PSA, active survillance;   12/ 2021  risking detectable PSA ,  completed salvage IMRT 02-07-2021  ? 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 followed by dr Dennie Fetters--- (03-06-2022  pt stated residual central blind spot)   cental artery is also occluded;   06/ 2022  acute vision loss, dx left ischemic  non-arteric anterior optic neuropathy (had neurologist work-up by dr r. sater)  ? Pre-diabetes   ? Seasonal allergies   ? Wears glasses    ? ? ?Past Surgical History:  ?Procedure Laterality Date  ? ACHILLES TENDON REPAIR Left 02/17/2006  ? '@[facility]'$   and  re-vision  approx 3 months later  ? ANTERIOR CERVICAL DECOMP/DISCECTOMY FUSION  12/27/2009  ? '@[facility]'$  by dr Hal Neer;  C3--5  ? COLONOSCOPY  2014  ? LYMPHADENECTOMY Bilateral 07/28/2016  ? Procedure: LYMPHADENECTOMY;  Surgeon: [name], MD;  Location: [facility];  Service: Urology;  Laterality: Bilateral;  ? ROBOT ASSISTED LAPAROSCOPIC RADICAL PROSTATECTOMY N/A 07/28/2016  ? Procedure: XI ROBOTIC ASSISTED LAPAROSCOPIC RADICAL PROSTATECTOMY;  Surgeon: [name], MD;  Location: [facility];  Service: Urology;  Laterality: N/A;  ? TONSILLECTOMY    ? child  ? ? ?Home Medications:  ?Current Meds  ?Medication Sig  ? ALPRAZolam (XANAX) 0.5 MG tablet Take 0.5 mg by mouth at bedtime as needed for anxiety.  ? amLODipine (NORVASC) 10 MG tablet Take 10 mg by mouth daily.  ? atorvastatin (LIPITOR) 20 MG tablet Take 20 mg by mouth daily.  ? benzonatate (TESSALON) 100 MG capsule Take 100 mg by mouth 3 (three) times daily as needed for cough.  ? cloNIDine (CATAPRES) 0.1 MG tablet Take 0.1 mg by mouth 2 (two) times daily.  ? diclofenac Sodium (VOLTAREN) 1 % GEL Apply topically 3 (three) times daily as needed.  ? diphenhydrAMINE (BENADRYL) 25 MG tablet Take  25 mg by mouth at bedtime as needed (takes 2 , 50 mg total).  ? fexofenadine (ALLEGRA) 180 MG tablet Take 180 mg by mouth daily.  ? fluticasone (FLONASE) 50 MCG/ACT nasal spray Place 2 sprays into the nose at bedtime.  ? guaiFENesin (MUCINEX) 600 MG 12 hr tablet Take 600 mg by mouth daily.   ? ibuprofen (ADVIL) 200 MG tablet Take 200 mg by mouth every 6 (six) hours as needed.  ? lisinopril (ZESTRIL) 30 MG tablet Take 30 mg by mouth daily.  ? MELATONIN PO Take 1 capsule by mouth at bedtime.  ? mometasone (NASONEX) 50 MCG/ACT nasal spray 2 sprays daily as needed.  ? promethazine (PHENERGAN) 25 MG tablet Take 25 mg by mouth every 6 (six) hours as needed for nausea or vomiting.   ? Propylene Glycol (SYSTANE COMPLETE) 0.6 % SOLN Place 1 drop into both eyes 3 (three) times daily.  ? sildenafil (REVATIO) 20 MG tablet Take 20 mg by mouth as needed.  ? traZODone (DESYREL) 50 MG tablet Take 50 mg by mouth at bedtime.  ? [DISCONTINUED] Atorvastatin Calcium (LIPITOR PO) Take 1 tablet by mouth daily.  ? [DISCONTINUED] CLONIDINE HCL PO Take 1 tablet by mouth 2 (two) times daily.  ? [DISCONTINUED] loratadine (CLARITIN) 10 MG tablet Take 10 mg by mouth daily.  ? [DISCONTINUED] Papav-Phentolamine-Alprostadil (TRI-MIX IC) by Intracavernosal route.  ? ? ?Allergies:  ?Allergies  ?Allergen Reactions  ? Ambien [Zolpidem] Other (See Comments)  ?  Wild behavior  ? ? ?Family History  ?Problem Relation Age of Onset  ? Stroke Mother   ? Cancer Neg Hx   ? ? ?Social History:  reports that he has never smoked. He has never used smokeless tobacco. He reports current alcohol use of about 4.0 standard drinks per week. He reports that he does not use drugs. ? ?ROS: ?A complete review of systems was performed.  All systems are negative except for pertinent findings as noted. ? ?Physical Exam:  ?Vital signs in last 24 hours: ?  ?Constitutional:  Alert and oriented, No acute distress ?Cardiovascular: Regular rate and rhythm, No JVD ?Respiratory: Normal respiratory effort, Lungs clear bilaterally ?GI: Abdomen is soft, nontender, nondistended, no abdominal masses ?GU: No CVA tenderness ?Lymphatic: No lymphadenopathy ?Neurologic: Grossly intact, no focal deficits ?Psychiatric: Normal mood and affect ?Drains/Tubes: none ? ? ?Laboratory Data:  ?No results for input(s): WBC, HGB, HCT, PLT in the last 72 hours. ? ?No results for input(s): NA, K, CL, GLUCOSE, BUN, CALCIUM, CREATININE in the last 72 hours. ? ?Invalid input(s): CO3 ? ? ?No results found for this or any previous visit (from the past 24 hour(s)). ?No results found for this or any previous visit (from the past 240 hour(s)). ? ?Renal Function: ?No results for input(s):  CREATININE in the last 168 hours. ?CrCl cannot be calculated (Patient's most recent lab result is older than the maximum 21 days allowed.). ? ?Radiologic Imaging: ?No results found. ? ?Assessment:  ?71 yo M with refractory ED ? ?Plan:  ?-to OR for penile prosthesis. Patient will be observed overnight after. All questions answered.  ? ?Donald Pore, MD ?03/11/2022, 10:57 AM  ?[facility] ?Pager: [phone] ? ? ?

## 2022-03-11 NOTE — Discharge Summary (Signed)
Date of admission: 03/11/2022 ? ?Date of discharge: 03/12/2022 ? ?Admission diagnosis:  Post-op pain [G89.18], s/p inflatable penile prosthesis insertion ? ?Discharge diagnosis:  Post-op pain [G89.18], s/p inflatable penile prosthesis insertion ? ?Secondary diagnoses:   ?Active Ambulatory Problems  ?  Diagnosis Date Noted  ? Prostate cancer ([place]) 07/28/2016  ? Allergic rhinitis 11/29/2019  ? Basal cell carcinoma 04/20/2014  ? Chronic low back pain 04/20/2014  ? Early satiety 05/28/2012  ? Hyperlipidemia 11/29/2019  ? Biochemically recurrent malignant neoplasm of prostate ([place]) 11/30/2019  ? ?Resolved Ambulatory Problems  ?  Diagnosis Date Noted  ? No Resolved Ambulatory Problems  ? ?Past Medical History:  ?Diagnosis Date  ? Arthritis   ? Chronic cough   ? Chronic rhinitis   ? Coronary atherosclerosis   ? Diverticulosis of colon   ? ED (erectile dysfunction)   ? History of COVID-19 10/2019  ? History of external beam radiation therapy   ? History of gastroesophageal reflux (GERD)   ? History of kidney stones 1996  ? History of palpitations in adulthood   ? History of syncope   ? Lower urinary tract symptoms (LUTS)   ? Malignant neoplasm prostate ([place]) 03/2016  ? 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 Pre-diabetes   ? Seasonal allergies   ? Wears glasses   ?  ? ?History and Physical: For full details, please see admission history and physical. Briefly, Gregory Middleton is a 71 y.o. year old patient who was admitted s/p insertion of inflatable penile prosthesis.  ? ?Hospital Course: POD 1, Gregory Middleton and foley were removed.  ? ?On the day of discharge, the patient was tolerating a regular diet and their pain was well controlled. They were determined to be stable for discharge home ? ?Laboratory values:  ?Recent Labs  ?  03/11/22 ?1055  ?HGB 13.3  ?HCT 39.0  ? ?Recent Labs  ?  03/11/22 ?1055  ?CREATININE 0.70  ? ? ?Disposition: Home ? ?Discharge medications:  ? ? ? ?Current  Facility-Administered Medications (Cardiovascular):  ?  amLODipine (NORVASC) tablet 10 mg ?  atorvastatin (LIPITOR) tablet 20 mg ?  lisinopril (ZESTRIL) tablet 30 mg ? ? ?Current Facility-Administered Medications (Respiratory):  ?  benzonatate (TESSALON) capsule 100 mg ?  diphenhydrAMINE (BENADRYL) capsule 25 mg ?  fluticasone (FLONASE) 50 MCG/ACT nasal spray 2 spray ?  guaiFENesin (MUCINEX) 12 hr tablet 600 mg ?  loratadine (CLARITIN) tablet 10 mg ? ? ?Current Facility-Administered Medications (Analgesics):  ?  celecoxib (CELEBREX) capsule 200 mg ?  fentaNYL (SUBLIMAZE) injection 25-50 mcg ?  HYDROcodone-acetaminophen (NORCO/VICODIN) 5-325 MG per tablet 1-2 tablet ?  HYDROmorphone (DILAUDID) injection 0.5 mg ?  oxyCODONE (Oxy IR/ROXICODONE) immediate release tablet 5 mg **OR** oxyCODONE (ROXICODONE) 5 MG/5ML solution 5 mg ? ?Current Outpatient Medications (Analgesics):  ?  oxyCODONE (ROXICODONE) 5 MG immediate release tablet, Take 1 tablet (5 mg total) by mouth every 8 (eight) hours as needed for severe pain. ?  celecoxib (CELEBREX) 200 MG capsule, Take 1 capsule (200 mg total) by mouth 2 (two) times daily as needed for moderate pain or mild pain. ? ? ? ?Current Facility-Administered Medications (Other):  ?  0.9 %  sodium chloride infusion ?  cholecalciferol (VITAMIN D) tablet 1,000 Units ?  gentamicin (GARAMYCIN) 380 mg in dextrose 5 % 50 mL IVPB ?  melatonin tablet 5 mg ?  ondansetron (ZOFRAN-ODT) disintegrating tablet 4 mg **OR** ondansetron (ZOFRAN) injection 4 mg ?  Propylene Glycol 0.6 %  SOLN 1 drop ?  vancomycin (VANCOCIN) IVPB 1000 mg/200 mL premix ? ?Current Outpatient Medications (Other):  ?  sulfamethoxazole-trimethoprim (BACTRIM DS) 800-160 MG tablet, Take 1 tablet by mouth 2 (two) times daily.  ? ?Followup:  ? Follow-up Information   ? ? Vira Agar, MD Follow up on 04/07/2022.   ?Specialty: Urology ?Why: appointment at 1:15 ?Contact information: ?[address] ?[place]  [address] ?[phone] ? ? ?  ?  ? ?  ?  ? ?  ? ? ?

## 2022-03-11 NOTE — Op Note (Signed)
PATIENT:  Gregory Middleton ? ?PRE-OPERATIVE DIAGNOSIS:  Organic erectile dysfunction ? ?POST-OPERATIVE DIAGNOSIS:  Same ? ?PROCEDURE:  Procedure(s): 3 piece inflatable penile prosthesis (BS/AMS) ? ?SURGEON:  Donald Pore MD ? ?ASST: Jimmy Footman, MD ? ?INDICATION: He has had long-standing organic erectile dysfunction and refractory to other modes of treatment. He has elected to proceed with prosthesis implantation. ? ?ANESTHESIA:  General ? ?EBL:  Minimal ? ?Device: 3 piece AMS CX 700: 65 cc reservoir, 18 cm cylinders and 3 cm rear-tip extenders on right and left sides ? ?LOCAL MEDICATIONS USED:  None ? ?SPECIMEN: None ? ?DISPOSITION OF SPECIMEN:  N/A ? ?Description of procedure: The patient was taken to the major operating room, placed on the table and administered general anesthesia in the supine position. His genitalia was then prepped with chlorhexidine x 2. He was draped in the usual sterile fashion, and I used [place] on the field. An official timeout was then performed. ? ?A 14 [place] coude catheter was then placed in the bladder and the bladder was drained and the catheter was plugged. A midline penoscrotal incision was then made and the dissection was carried down to the corpora and urethra. The lonestar retractor was positioned so as to have excellent exposure. 2-0 Vicryl sutures were then placed proximally in each corpus cavernosum to serve as stay sutures. An incision was then made in the corpus cavernosum first on the left-hand side with the bovie. [place] were used to gently dilate the opening. I then dilated the corpus cavernosum with the a 12 Fr brooks dilator distally and proximally. Field goal post tests were performed and there was no evidence of perforations or crossover. I then irrigated the corpus cavernosum with antibiotic solution and measured the distance proximally and distally from the stay suture on the right and was found to be 11.5 and 9.5 cm, respectively.I then turned my attention to the  contralateral corpus cavernosum and placed my stay sutures, made my corporotomy and dilated the corpus cavernosum in an identical fashion. This was measured and also was found to be 12.5 cm proximally and 8.5 distally. It was irrigated with anastomotic solution as was the scrotum. I then chose an 18 cm cylinder set with 3 cm rear-tip extenders and these were prepped while I prepared the site for reservoir placement. ? ?I then digitally probed into the R external inguinal ring. A kelly was used to poke through the posterior wall of the ring. I used my finger to ensure I was in the appropriate space, and to clear room for the reservoir. I irrigated the space with anastomotic solution and then placed the reservoir in this location. I then filled the reservoir with 65 cc of sterile saline, and checked to confirm proper position. There was minimal backpressure with the reservoir max-filled. ? ?Attention was redirected to the corporotomies where the cylinders were then placed by first fixing the suture to the distal aspect of the right cylinder to a straight needle. This was then loaded on the [facility] inserter and passed through the corporotomy and distally. I then advanced the straight needle with the [place] inserter out through the glans and this was grasped with a hemostat and pulled through the glans and the suture was secured with a hemostat. I then performed an identical maneuver on the contralateral side. After this was performed I irrigated both corpus cavernosum; there was no evidence of urethral perforation. I inserted the distal portion of the cylinder through the corporotomies and pulled this to the  end of the corpora with the suture. The proximal aspect with the rear-tip extender was then passed through the corporotomy and into the seated position on each side. I then connected reservoir tubing to a syringe filled with sterile saline and inflated the device. I noted a good straight erection with both cylinders  equidistant under the glans and no buckling of the cylinders. I therefore deflated the device and closed the corporotomies with used my previously placed stay sutures. The cylinder was then connected to the pump after excising the excess tubing with appropriate shodded hemostats in place and then I used the supplied connectors to make the connection. I then again cycled the device with the pump and it cycled properly. I deflated the device and pumped it up about three quarters of the way to aid with hemostasis. I irrigated the scrotum once again with metabolic solution. ? ?I then grasped the scrotal skin in the midline with a babcock, and used a hemostat to dissect down to the dependent-most portion of the scrotum. The nasal speculum was inserted into this space, and facilitated placement of the pump. I irrigated the wound one last time with antibiotic irrigation and then closed the deep scrotal tissue over the tubing and pump with running 3-0 monocryl suture. I placed a 10 Fr blake drain over the corporotomies. A second layer was then closed over this first layer with running 3- 0 monocryl, and running skin suture w/ 4-0 monocryl performed. Incision dressed with dermabond.  A mummy wrap was applied. The catheter was connected to closed system drainage, and drain connected to suction bulb and the patient was awakened and taken recovery room in stable and satisfactory condition. He tolerated the procedure well and there were no intraoperative complications. Needle sponge and instrument counts were correct at the end of the operation. ? ?

## 2022-03-11 NOTE — Discharge Instructions (Addendum)
Penile prosthesis postoperative instructions ? ?Wound: ? ?In most cases your incision will have absorbable sutures and purple skin glue that will dissolve within the first 10-20 days. Some will fall out even earlier. Expect some redness as the sutures dissolved but this should occur only around the sutures.  ? ?If there is generalized redness, especially with increasing pain or swelling, let us know. The scrotum and penis will very likely get "black and blue" as the blood in the tissues spread. Sometimes the whole scrotum will turn colors. The black and blue is followed by a yellow and brown color. In time, all the discoloration will go away. In some cases some firm swelling in the area of the testicle and pump may persist for up to 4-6 weeks after the surgery and is considered normal in most cases. ? ?Drain:  ? ?You may be discharged home with a drain in place. If so, you will be taught how to empty it and should keep track of the output. Additionally, you should call the office to arrange for an appointment to have it removed after a few days.  ? ?Diet: ? ?You may return to your normal diet within 24 hours following your surgery. You may note some mild nausea and possibly vomiting the first 6-8 hours following surgery. This is usually due to the side effects of anesthesia, and will disappear quite soon. I would suggest clear liquids and a very light meal the first evening following your surgery. ? ?Activity: ? ?Your physical activity should be restricted the first 48 hours. During that time you should remain relatively inactive, moving about only when necessary. During the first 3 weeks following surgery you should avoid lifting any heavy objects (anything greater than 15 pounds), and avoid strenuous exercise. If you work, ask [place] specifically about your restrictions, both for work and home. We will write a note to your employer if needed. ? ?Avoid using your penis until your follow up visit with Dr Cain Sieve, which  will typically be around 3-4 weeks following the surgery. Most people are able to start cycling their device after that appointment, and can have intercourse soon thereafter.  ? ?You should plan to wear a tight pair of jockey shorts or an athletic supporter for the first 4-5 days, even to sleep. This will keep the scrotum immobilized to some degree and keep the swelling down.The position of your penis will determine what is most comfortable but I strongly urge you to keep the penis in the "up" position (toward your head). You should continue to tuck "up" your penis when possible for the first 3 months following surgery. ? ?Ice packs should be placed on and off over the scrotum for the first 48 hours. Frozen peas or corn in a ZipLock bag can be frozen, used and re-frozen. Fifteen minutes on and 15 minutes off is a reasonable schedule. The ice is a good pain reliever and keeps the swelling down. ? ?Hygiene: ? ?You may shower 48 hours after your surgery. Tub bathing should be restricted until the wound is completely healed, typically around 2-3 weeks. ? ?Medication: ? ?You will be sent home with some type of pain medication. In many cases you will be sent home with a strong anti-inflammatory medication (Celebrex, Meloxicam) and a narcotic pain pill (hydrocodone or oxycodone). You can also supplement these medications with tylenol (acetaminophen). If the pain medication you are sent home with does not control the pain, please notify the office ?Problems you should report to  [place]: ? ?Fever of 101.0 degrees Fahrenheit or greater. ?Moderate or severe swelling under the skin incision or involving the scrotum. ?Drug reaction such as hives, a rash, nausea or vomiting. ? ?

## 2022-03-11 NOTE — Transfer of Care (Signed)
Immediate Anesthesia Transfer of Care Note ? ?Patient: Gregory Middleton ? ?Procedure(s) Performed: [place] (Scrotum) ? ?Patient Location: PACU ? ?Anesthesia Type:General ? ?Level of Consciousness: awake, alert  and oriented ? ?Airway & Oxygen Therapy: Patient Spontanous Breathing ? ?Post-op Assessment: Report given to RN and Post -op Vital signs reviewed and stable ? ?Post vital signs: Reviewed and stable ? ?Last Vitals:  ?Vitals Value Taken Time  ?BP 122/65 03/11/22 1521  ?Temp    ?Pulse 88 03/11/22 1522  ?Resp 13 03/11/22 1522  ?SpO2 96 % 03/11/22 1522  ?Vitals shown include unvalidated device data. ? ?Last Pain:  ?Vitals:  ? 03/11/22 1127  ?TempSrc: Oral  ?PainSc: 0-No pain  ?   ? ?Patients Stated Pain Goal: 5 (03/11/22 1127) ? ?Complications: No notable events documented. ?

## 2022-03-12 ENCOUNTER — Encounter (HOSPITAL_BASED_OUTPATIENT_CLINIC_OR_DEPARTMENT_OTHER): Payer: Self-pay | Admitting: Urology

## 2022-03-12 DIAGNOSIS — N529 Male erectile dysfunction, unspecified: Secondary | ICD-10-CM | POA: Diagnosis not present

## 2022-03-12 MED ORDER — HYDROCODONE-ACETAMINOPHEN 5-325 MG PO TABS
ORAL_TABLET | ORAL | Status: AC
  Filled 2022-03-12: qty 2

## 2022-03-12 MED ORDER — SULFAMETHOXAZOLE-TRIMETHOPRIM 800-160 MG PO TABS: 1.0000 | ORAL_TABLET | Freq: Two times a day (BID) | ORAL | 0 refills | Status: AC

## 2022-03-12 MED ORDER — CELECOXIB 200 MG PO CAPS: 200.0000 mg | ORAL_CAPSULE | Freq: Two times a day (BID) | ORAL | 1 refills | Status: AC | PRN

## 2022-03-12 MED ORDER — OXYCODONE HCL 5 MG PO TABS: 5.0000 mg | ORAL_TABLET | Freq: Three times a day (TID) | ORAL | 0 refills | Status: AC | PRN

## 2022-03-12 NOTE — Progress Notes (Signed)
1 Day Post-Op ?Subjective: ?[name] is doing well this morning. Pain well controlled and tolerating diet ? ?Objective: ?Vital signs in last 24 hours: ?Temp:  [97.5 ?F (36.4 ?C)-98.2 ?F (36.8 ?C)] 98.1 ?F (36.7 ?C) (03/29 0546) ?Pulse Rate:  [63-87] 63 (03/29 0546) ?Resp:  [10-22] 20 (03/29 0546) ?BP: (107-136)/(57-120) 108/57 (03/29 0546) ?SpO2:  [93 %-100 %] 95 % (03/29 0546) ?Weight:  [90.4 kg] 90.4 kg (03/28 1127) ? ?Intake/Output from previous day: ?03/28 0701 - 03/29 0700 ?In: 9381 [P.O.:220; I.V.:1330; IV Piggyback:200] ?Out: 1940 [Urine:1825; Drains:85; Blood:30] ?Intake/Output this shift: ?No intake/output data recorded. ? ?Physical Exam:  ?General: Alert and oriented ?CV: RRR ?Lungs: Clear ?Abdomen: Soft, ND, ATTP ?GU: incision c/d/I. Drain with scant s/s output ?Ext: NT, No erythema ?Foley with clear yellow urine ? ?Lab Results: ?Recent Labs  ?  03/11/22 ?1055  ?HGB 13.3  ?HCT 39.0  ? ?BMET ?Recent Labs  ?  03/11/22 ?1055  ?NA 140  ?K 4.1  ?CL 103  ?GLUCOSE 119*  ?BUN 15  ?CREATININE 0.70  ? ? ? ?Studies/Results: ?No results found. ? ?Assessment/Plan: ?71 yo s/p IPP insertion POD 1 ?-foley dc'd ?-drain dc'd ?-ok to discharge once voids with plans to f/u in 1 month ? ? LOS: 0 days  ? ?Donald Pore MD ?03/12/2022, 7:16 AM ?[facility]  ?Pager: [phone] ? ? ? ?

## 2022-03-27 DIAGNOSIS — H532 Diplopia: Secondary | ICD-10-CM | POA: Diagnosis not present

## 2022-04-07 DIAGNOSIS — N5201 Erectile dysfunction due to arterial insufficiency: Secondary | ICD-10-CM | POA: Diagnosis not present

## 2022-06-02 DIAGNOSIS — E782 Mixed hyperlipidemia: Secondary | ICD-10-CM | POA: Diagnosis not present

## 2022-06-02 DIAGNOSIS — I10 Essential (primary) hypertension: Secondary | ICD-10-CM | POA: Diagnosis not present

## 2022-06-02 DIAGNOSIS — E7849 Other hyperlipidemia: Secondary | ICD-10-CM | POA: Diagnosis not present

## 2022-06-02 DIAGNOSIS — R7301 Impaired fasting glucose: Secondary | ICD-10-CM | POA: Diagnosis not present

## 2022-06-04 DIAGNOSIS — F959 Tic disorder, unspecified: Secondary | ICD-10-CM | POA: Diagnosis not present

## 2022-06-04 DIAGNOSIS — Z6831 Body mass index (BMI) 31.0-31.9, adult: Secondary | ICD-10-CM | POA: Diagnosis not present

## 2022-06-04 DIAGNOSIS — G4709 Other insomnia: Secondary | ICD-10-CM | POA: Diagnosis not present

## 2022-06-04 DIAGNOSIS — C61 Malignant neoplasm of prostate: Secondary | ICD-10-CM | POA: Diagnosis not present

## 2022-06-04 DIAGNOSIS — I10 Essential (primary) hypertension: Secondary | ICD-10-CM | POA: Diagnosis not present

## 2022-06-04 DIAGNOSIS — E7849 Other hyperlipidemia: Secondary | ICD-10-CM | POA: Diagnosis not present

## 2022-06-04 DIAGNOSIS — R7303 Prediabetes: Secondary | ICD-10-CM | POA: Diagnosis not present

## 2022-07-10 DIAGNOSIS — N5201 Erectile dysfunction due to arterial insufficiency: Secondary | ICD-10-CM | POA: Diagnosis not present

## 2022-08-07 DIAGNOSIS — R7303 Prediabetes: Secondary | ICD-10-CM | POA: Diagnosis not present

## 2022-08-07 DIAGNOSIS — C61 Malignant neoplasm of prostate: Secondary | ICD-10-CM | POA: Diagnosis not present

## 2022-08-07 DIAGNOSIS — R635 Abnormal weight gain: Secondary | ICD-10-CM | POA: Diagnosis not present

## 2022-08-07 DIAGNOSIS — T50905A Adverse effect of unspecified drugs, medicaments and biological substances, initial encounter: Secondary | ICD-10-CM | POA: Diagnosis not present

## 2022-08-07 DIAGNOSIS — F959 Tic disorder, unspecified: Secondary | ICD-10-CM | POA: Diagnosis not present

## 2022-08-07 DIAGNOSIS — Z6833 Body mass index (BMI) 33.0-33.9, adult: Secondary | ICD-10-CM | POA: Diagnosis not present

## 2022-08-07 DIAGNOSIS — I10 Essential (primary) hypertension: Secondary | ICD-10-CM | POA: Diagnosis not present

## 2022-08-07 DIAGNOSIS — E7849 Other hyperlipidemia: Secondary | ICD-10-CM | POA: Diagnosis not present

## 2022-08-07 DIAGNOSIS — R600 Localized edema: Secondary | ICD-10-CM | POA: Diagnosis not present

## 2022-08-07 DIAGNOSIS — G4709 Other insomnia: Secondary | ICD-10-CM | POA: Diagnosis not present

## 2022-08-11 DIAGNOSIS — C61 Malignant neoplasm of prostate: Secondary | ICD-10-CM | POA: Diagnosis not present

## 2022-08-26 DIAGNOSIS — R635 Abnormal weight gain: Secondary | ICD-10-CM | POA: Diagnosis not present

## 2022-08-26 DIAGNOSIS — G4709 Other insomnia: Secondary | ICD-10-CM | POA: Diagnosis not present

## 2022-08-26 DIAGNOSIS — E7849 Other hyperlipidemia: Secondary | ICD-10-CM | POA: Diagnosis not present

## 2022-08-26 DIAGNOSIS — E1165 Type 2 diabetes mellitus with hyperglycemia: Secondary | ICD-10-CM | POA: Diagnosis not present

## 2022-08-26 DIAGNOSIS — R5383 Other fatigue: Secondary | ICD-10-CM | POA: Diagnosis not present

## 2022-08-26 DIAGNOSIS — I10 Essential (primary) hypertension: Secondary | ICD-10-CM | POA: Diagnosis not present

## 2022-08-29 DIAGNOSIS — N3941 Urge incontinence: Secondary | ICD-10-CM | POA: Diagnosis not present

## 2022-08-29 DIAGNOSIS — C61 Malignant neoplasm of prostate: Secondary | ICD-10-CM | POA: Diagnosis not present

## 2022-08-29 DIAGNOSIS — N5201 Erectile dysfunction due to arterial insufficiency: Secondary | ICD-10-CM | POA: Diagnosis not present

## 2022-09-02 DIAGNOSIS — E1165 Type 2 diabetes mellitus with hyperglycemia: Secondary | ICD-10-CM | POA: Diagnosis not present

## 2022-09-02 DIAGNOSIS — R7301 Impaired fasting glucose: Secondary | ICD-10-CM | POA: Diagnosis not present

## 2022-09-02 DIAGNOSIS — R030 Elevated blood-pressure reading, without diagnosis of hypertension: Secondary | ICD-10-CM | POA: Diagnosis not present

## 2022-09-02 DIAGNOSIS — C61 Malignant neoplasm of prostate: Secondary | ICD-10-CM | POA: Diagnosis not present

## 2022-09-02 DIAGNOSIS — E7849 Other hyperlipidemia: Secondary | ICD-10-CM | POA: Diagnosis not present

## 2022-09-02 DIAGNOSIS — F959 Tic disorder, unspecified: Secondary | ICD-10-CM | POA: Diagnosis not present

## 2022-09-02 DIAGNOSIS — Z6833 Body mass index (BMI) 33.0-33.9, adult: Secondary | ICD-10-CM | POA: Diagnosis not present

## 2022-09-02 DIAGNOSIS — R635 Abnormal weight gain: Secondary | ICD-10-CM | POA: Diagnosis not present

## 2022-09-02 DIAGNOSIS — R4789 Other speech disturbances: Secondary | ICD-10-CM | POA: Diagnosis not present

## 2022-11-26 DIAGNOSIS — E7849 Other hyperlipidemia: Secondary | ICD-10-CM | POA: Diagnosis not present

## 2022-11-26 DIAGNOSIS — E1165 Type 2 diabetes mellitus with hyperglycemia: Secondary | ICD-10-CM | POA: Diagnosis not present

## 2022-11-26 DIAGNOSIS — R5383 Other fatigue: Secondary | ICD-10-CM | POA: Diagnosis not present

## 2022-11-26 DIAGNOSIS — R7301 Impaired fasting glucose: Secondary | ICD-10-CM | POA: Diagnosis not present

## 2022-11-26 DIAGNOSIS — K5730 Diverticulosis of large intestine without perforation or abscess without bleeding: Secondary | ICD-10-CM | POA: Diagnosis not present

## 2022-11-26 DIAGNOSIS — Z1329 Encounter for screening for other suspected endocrine disorder: Secondary | ICD-10-CM | POA: Diagnosis not present

## 2022-12-04 DIAGNOSIS — R5383 Other fatigue: Secondary | ICD-10-CM | POA: Diagnosis not present

## 2022-12-04 DIAGNOSIS — E7849 Other hyperlipidemia: Secondary | ICD-10-CM | POA: Diagnosis not present

## 2022-12-04 DIAGNOSIS — F959 Tic disorder, unspecified: Secondary | ICD-10-CM | POA: Diagnosis not present

## 2022-12-04 DIAGNOSIS — G4700 Insomnia, unspecified: Secondary | ICD-10-CM | POA: Diagnosis not present

## 2022-12-04 DIAGNOSIS — C61 Malignant neoplasm of prostate: Secondary | ICD-10-CM | POA: Diagnosis not present

## 2022-12-04 DIAGNOSIS — R030 Elevated blood-pressure reading, without diagnosis of hypertension: Secondary | ICD-10-CM | POA: Diagnosis not present

## 2022-12-04 DIAGNOSIS — Z6832 Body mass index (BMI) 32.0-32.9, adult: Secondary | ICD-10-CM | POA: Diagnosis not present

## 2022-12-04 DIAGNOSIS — R600 Localized edema: Secondary | ICD-10-CM | POA: Diagnosis not present

## 2022-12-04 DIAGNOSIS — R7301 Impaired fasting glucose: Secondary | ICD-10-CM | POA: Diagnosis not present

## 2023-01-13 DIAGNOSIS — H35372 Puckering of macula, left eye: Secondary | ICD-10-CM | POA: Diagnosis not present

## 2023-01-13 DIAGNOSIS — H524 Presbyopia: Secondary | ICD-10-CM | POA: Diagnosis not present

## 2023-01-20 DIAGNOSIS — Z85828 Personal history of other malignant neoplasm of skin: Secondary | ICD-10-CM | POA: Diagnosis not present

## 2023-01-20 DIAGNOSIS — L570 Actinic keratosis: Secondary | ICD-10-CM | POA: Diagnosis not present

## 2023-01-20 DIAGNOSIS — Z1283 Encounter for screening for malignant neoplasm of skin: Secondary | ICD-10-CM | POA: Diagnosis not present

## 2023-01-20 DIAGNOSIS — D239 Other benign neoplasm of skin, unspecified: Secondary | ICD-10-CM | POA: Diagnosis not present

## 2023-01-26 DIAGNOSIS — Z1211 Encounter for screening for malignant neoplasm of colon: Secondary | ICD-10-CM | POA: Diagnosis not present

## 2023-02-06 DIAGNOSIS — C61 Malignant neoplasm of prostate: Secondary | ICD-10-CM | POA: Diagnosis not present

## 2023-02-13 DIAGNOSIS — C61 Malignant neoplasm of prostate: Secondary | ICD-10-CM | POA: Diagnosis not present

## 2023-02-13 DIAGNOSIS — N5201 Erectile dysfunction due to arterial insufficiency: Secondary | ICD-10-CM | POA: Diagnosis not present

## 2023-03-12 DIAGNOSIS — E785 Hyperlipidemia, unspecified: Secondary | ICD-10-CM | POA: Diagnosis not present

## 2023-03-12 DIAGNOSIS — I10 Essential (primary) hypertension: Secondary | ICD-10-CM | POA: Diagnosis not present

## 2023-03-12 DIAGNOSIS — Z79899 Other long term (current) drug therapy: Secondary | ICD-10-CM | POA: Diagnosis not present

## 2023-03-12 DIAGNOSIS — K5730 Diverticulosis of large intestine without perforation or abscess without bleeding: Secondary | ICD-10-CM | POA: Diagnosis not present

## 2023-03-12 DIAGNOSIS — Z1211 Encounter for screening for malignant neoplasm of colon: Secondary | ICD-10-CM | POA: Diagnosis not present

## 2023-03-12 DIAGNOSIS — K5790 Diverticulosis of intestine, part unspecified, without perforation or abscess without bleeding: Secondary | ICD-10-CM | POA: Diagnosis not present

## 2023-04-30 DIAGNOSIS — H47012 Ischemic optic neuropathy, left eye: Secondary | ICD-10-CM | POA: Diagnosis not present

## 2023-05-04 DIAGNOSIS — I10 Essential (primary) hypertension: Secondary | ICD-10-CM | POA: Diagnosis not present

## 2023-05-04 DIAGNOSIS — C61 Malignant neoplasm of prostate: Secondary | ICD-10-CM | POA: Diagnosis not present

## 2023-05-04 DIAGNOSIS — R5383 Other fatigue: Secondary | ICD-10-CM | POA: Diagnosis not present

## 2023-05-04 DIAGNOSIS — R5381 Other malaise: Secondary | ICD-10-CM | POA: Diagnosis not present

## 2023-05-04 DIAGNOSIS — Z6832 Body mass index (BMI) 32.0-32.9, adult: Secondary | ICD-10-CM | POA: Diagnosis not present

## 2023-05-04 DIAGNOSIS — G4700 Insomnia, unspecified: Secondary | ICD-10-CM | POA: Diagnosis not present

## 2023-05-04 DIAGNOSIS — E7849 Other hyperlipidemia: Secondary | ICD-10-CM | POA: Diagnosis not present

## 2023-05-04 DIAGNOSIS — F959 Tic disorder, unspecified: Secondary | ICD-10-CM | POA: Diagnosis not present

## 2023-06-02 DIAGNOSIS — E7849 Other hyperlipidemia: Secondary | ICD-10-CM | POA: Diagnosis not present

## 2023-06-02 DIAGNOSIS — Z0001 Encounter for general adult medical examination with abnormal findings: Secondary | ICD-10-CM | POA: Diagnosis not present

## 2023-06-02 DIAGNOSIS — E1165 Type 2 diabetes mellitus with hyperglycemia: Secondary | ICD-10-CM | POA: Diagnosis not present

## 2023-06-02 DIAGNOSIS — K760 Fatty (change of) liver, not elsewhere classified: Secondary | ICD-10-CM | POA: Diagnosis not present

## 2023-06-09 DIAGNOSIS — R7301 Impaired fasting glucose: Secondary | ICD-10-CM | POA: Diagnosis not present

## 2023-06-09 DIAGNOSIS — I10 Essential (primary) hypertension: Secondary | ICD-10-CM | POA: Diagnosis not present

## 2023-06-09 DIAGNOSIS — G4700 Insomnia, unspecified: Secondary | ICD-10-CM | POA: Diagnosis not present

## 2023-06-09 DIAGNOSIS — F959 Tic disorder, unspecified: Secondary | ICD-10-CM | POA: Diagnosis not present

## 2023-06-09 DIAGNOSIS — Z6832 Body mass index (BMI) 32.0-32.9, adult: Secondary | ICD-10-CM | POA: Diagnosis not present

## 2023-06-09 DIAGNOSIS — C61 Malignant neoplasm of prostate: Secondary | ICD-10-CM | POA: Diagnosis not present

## 2023-06-09 DIAGNOSIS — E782 Mixed hyperlipidemia: Secondary | ICD-10-CM | POA: Diagnosis not present

## 2023-06-09 DIAGNOSIS — K5730 Diverticulosis of large intestine without perforation or abscess without bleeding: Secondary | ICD-10-CM | POA: Diagnosis not present

## 2023-06-09 DIAGNOSIS — Z0001 Encounter for general adult medical examination with abnormal findings: Secondary | ICD-10-CM | POA: Diagnosis not present

## 2023-06-09 DIAGNOSIS — I700 Atherosclerosis of aorta: Secondary | ICD-10-CM | POA: Diagnosis not present

## 2023-06-09 DIAGNOSIS — R5381 Other malaise: Secondary | ICD-10-CM | POA: Diagnosis not present

## 2023-06-09 DIAGNOSIS — M12812 Other specific arthropathies, not elsewhere classified, left shoulder: Secondary | ICD-10-CM | POA: Diagnosis not present

## 2023-07-08 DIAGNOSIS — M19012 Primary osteoarthritis, left shoulder: Secondary | ICD-10-CM | POA: Diagnosis not present

## 2023-07-08 DIAGNOSIS — Z6832 Body mass index (BMI) 32.0-32.9, adult: Secondary | ICD-10-CM | POA: Diagnosis not present

## 2023-07-08 DIAGNOSIS — M75102 Unspecified rotator cuff tear or rupture of left shoulder, not specified as traumatic: Secondary | ICD-10-CM | POA: Diagnosis not present

## 2023-07-08 DIAGNOSIS — R030 Elevated blood-pressure reading, without diagnosis of hypertension: Secondary | ICD-10-CM | POA: Diagnosis not present

## 2023-07-08 DIAGNOSIS — M25512 Pain in left shoulder: Secondary | ICD-10-CM | POA: Diagnosis not present

## 2023-07-20 DIAGNOSIS — M7580 Other shoulder lesions, unspecified shoulder: Secondary | ICD-10-CM | POA: Diagnosis not present

## 2023-07-20 DIAGNOSIS — M6281 Muscle weakness (generalized): Secondary | ICD-10-CM | POA: Diagnosis not present

## 2023-07-20 DIAGNOSIS — M25512 Pain in left shoulder: Secondary | ICD-10-CM | POA: Diagnosis not present

## 2023-07-27 DIAGNOSIS — M7580 Other shoulder lesions, unspecified shoulder: Secondary | ICD-10-CM | POA: Diagnosis not present

## 2023-07-27 DIAGNOSIS — M25512 Pain in left shoulder: Secondary | ICD-10-CM | POA: Diagnosis not present

## 2023-07-27 DIAGNOSIS — M6281 Muscle weakness (generalized): Secondary | ICD-10-CM | POA: Diagnosis not present

## 2023-08-03 DIAGNOSIS — M7580 Other shoulder lesions, unspecified shoulder: Secondary | ICD-10-CM | POA: Diagnosis not present

## 2023-08-03 DIAGNOSIS — M25512 Pain in left shoulder: Secondary | ICD-10-CM | POA: Diagnosis not present

## 2023-08-03 DIAGNOSIS — M6281 Muscle weakness (generalized): Secondary | ICD-10-CM | POA: Diagnosis not present

## 2023-08-10 DIAGNOSIS — M6281 Muscle weakness (generalized): Secondary | ICD-10-CM | POA: Diagnosis not present

## 2023-08-10 DIAGNOSIS — M7580 Other shoulder lesions, unspecified shoulder: Secondary | ICD-10-CM | POA: Diagnosis not present

## 2023-08-10 DIAGNOSIS — M25512 Pain in left shoulder: Secondary | ICD-10-CM | POA: Diagnosis not present

## 2023-08-25 DIAGNOSIS — R030 Elevated blood-pressure reading, without diagnosis of hypertension: Secondary | ICD-10-CM | POA: Diagnosis not present

## 2023-08-25 DIAGNOSIS — M19012 Primary osteoarthritis, left shoulder: Secondary | ICD-10-CM | POA: Diagnosis not present

## 2023-08-25 DIAGNOSIS — M75102 Unspecified rotator cuff tear or rupture of left shoulder, not specified as traumatic: Secondary | ICD-10-CM | POA: Diagnosis not present

## 2023-08-25 DIAGNOSIS — Z6831 Body mass index (BMI) 31.0-31.9, adult: Secondary | ICD-10-CM | POA: Diagnosis not present

## 2023-09-11 DIAGNOSIS — R030 Elevated blood-pressure reading, without diagnosis of hypertension: Secondary | ICD-10-CM | POA: Diagnosis not present

## 2023-09-11 DIAGNOSIS — Z6830 Body mass index (BMI) 30.0-30.9, adult: Secondary | ICD-10-CM | POA: Diagnosis not present

## 2023-09-11 DIAGNOSIS — M75102 Unspecified rotator cuff tear or rupture of left shoulder, not specified as traumatic: Secondary | ICD-10-CM | POA: Diagnosis not present

## 2023-09-11 DIAGNOSIS — U071 COVID-19: Secondary | ICD-10-CM | POA: Diagnosis not present

## 2023-09-11 DIAGNOSIS — R059 Cough, unspecified: Secondary | ICD-10-CM | POA: Diagnosis not present

## 2023-10-12 DIAGNOSIS — M19012 Primary osteoarthritis, left shoulder: Secondary | ICD-10-CM | POA: Diagnosis not present

## 2023-10-12 DIAGNOSIS — R030 Elevated blood-pressure reading, without diagnosis of hypertension: Secondary | ICD-10-CM | POA: Diagnosis not present

## 2023-10-12 DIAGNOSIS — Z6830 Body mass index (BMI) 30.0-30.9, adult: Secondary | ICD-10-CM | POA: Diagnosis not present

## 2023-10-12 DIAGNOSIS — M75102 Unspecified rotator cuff tear or rupture of left shoulder, not specified as traumatic: Secondary | ICD-10-CM | POA: Diagnosis not present

## 2023-11-23 DIAGNOSIS — I10 Essential (primary) hypertension: Secondary | ICD-10-CM | POA: Diagnosis not present

## 2023-11-23 DIAGNOSIS — E1165 Type 2 diabetes mellitus with hyperglycemia: Secondary | ICD-10-CM | POA: Diagnosis not present

## 2023-11-23 DIAGNOSIS — R5383 Other fatigue: Secondary | ICD-10-CM | POA: Diagnosis not present

## 2023-11-23 DIAGNOSIS — E7849 Other hyperlipidemia: Secondary | ICD-10-CM | POA: Diagnosis not present

## 2023-11-23 DIAGNOSIS — Z1329 Encounter for screening for other suspected endocrine disorder: Secondary | ICD-10-CM | POA: Diagnosis not present

## 2023-11-30 DIAGNOSIS — R7301 Impaired fasting glucose: Secondary | ICD-10-CM | POA: Diagnosis not present

## 2023-11-30 DIAGNOSIS — Z6830 Body mass index (BMI) 30.0-30.9, adult: Secondary | ICD-10-CM | POA: Diagnosis not present

## 2023-11-30 DIAGNOSIS — M75102 Unspecified rotator cuff tear or rupture of left shoulder, not specified as traumatic: Secondary | ICD-10-CM | POA: Diagnosis not present

## 2023-11-30 DIAGNOSIS — F959 Tic disorder, unspecified: Secondary | ICD-10-CM | POA: Diagnosis not present

## 2023-11-30 DIAGNOSIS — I10 Essential (primary) hypertension: Secondary | ICD-10-CM | POA: Diagnosis not present

## 2023-11-30 DIAGNOSIS — M19012 Primary osteoarthritis, left shoulder: Secondary | ICD-10-CM | POA: Diagnosis not present

## 2023-11-30 DIAGNOSIS — G4700 Insomnia, unspecified: Secondary | ICD-10-CM | POA: Diagnosis not present

## 2023-11-30 DIAGNOSIS — E7849 Other hyperlipidemia: Secondary | ICD-10-CM | POA: Diagnosis not present

## 2023-11-30 DIAGNOSIS — I700 Atherosclerosis of aorta: Secondary | ICD-10-CM | POA: Diagnosis not present

## 2024-01-08 DIAGNOSIS — M7580 Other shoulder lesions, unspecified shoulder: Secondary | ICD-10-CM | POA: Diagnosis not present

## 2024-01-08 DIAGNOSIS — M6281 Muscle weakness (generalized): Secondary | ICD-10-CM | POA: Diagnosis not present

## 2024-01-08 DIAGNOSIS — M25512 Pain in left shoulder: Secondary | ICD-10-CM | POA: Diagnosis not present

## 2024-01-12 DIAGNOSIS — M6281 Muscle weakness (generalized): Secondary | ICD-10-CM | POA: Diagnosis not present

## 2024-01-12 DIAGNOSIS — M25512 Pain in left shoulder: Secondary | ICD-10-CM | POA: Diagnosis not present

## 2024-01-12 DIAGNOSIS — M7580 Other shoulder lesions, unspecified shoulder: Secondary | ICD-10-CM | POA: Diagnosis not present

## 2024-01-15 DIAGNOSIS — M25512 Pain in left shoulder: Secondary | ICD-10-CM | POA: Diagnosis not present

## 2024-01-15 DIAGNOSIS — M7580 Other shoulder lesions, unspecified shoulder: Secondary | ICD-10-CM | POA: Diagnosis not present

## 2024-01-15 DIAGNOSIS — M6281 Muscle weakness (generalized): Secondary | ICD-10-CM | POA: Diagnosis not present

## 2024-01-18 DIAGNOSIS — H524 Presbyopia: Secondary | ICD-10-CM | POA: Diagnosis not present

## 2024-01-18 DIAGNOSIS — H35032 Hypertensive retinopathy, left eye: Secondary | ICD-10-CM | POA: Diagnosis not present

## 2024-01-18 DIAGNOSIS — M25512 Pain in left shoulder: Secondary | ICD-10-CM | POA: Diagnosis not present

## 2024-01-18 DIAGNOSIS — M6281 Muscle weakness (generalized): Secondary | ICD-10-CM | POA: Diagnosis not present

## 2024-01-18 DIAGNOSIS — M7580 Other shoulder lesions, unspecified shoulder: Secondary | ICD-10-CM | POA: Diagnosis not present

## 2024-01-19 DIAGNOSIS — L821 Other seborrheic keratosis: Secondary | ICD-10-CM | POA: Diagnosis not present

## 2024-01-19 DIAGNOSIS — L820 Inflamed seborrheic keratosis: Secondary | ICD-10-CM | POA: Diagnosis not present

## 2024-01-19 DIAGNOSIS — Z85828 Personal history of other malignant neoplasm of skin: Secondary | ICD-10-CM | POA: Diagnosis not present

## 2024-01-19 DIAGNOSIS — L570 Actinic keratosis: Secondary | ICD-10-CM | POA: Diagnosis not present

## 2024-01-19 DIAGNOSIS — D485 Neoplasm of uncertain behavior of skin: Secondary | ICD-10-CM | POA: Diagnosis not present

## 2024-01-22 DIAGNOSIS — M6281 Muscle weakness (generalized): Secondary | ICD-10-CM | POA: Diagnosis not present

## 2024-01-22 DIAGNOSIS — M25512 Pain in left shoulder: Secondary | ICD-10-CM | POA: Diagnosis not present

## 2024-01-22 DIAGNOSIS — M7580 Other shoulder lesions, unspecified shoulder: Secondary | ICD-10-CM | POA: Diagnosis not present

## 2024-01-25 DIAGNOSIS — M7580 Other shoulder lesions, unspecified shoulder: Secondary | ICD-10-CM | POA: Diagnosis not present

## 2024-01-25 DIAGNOSIS — M25512 Pain in left shoulder: Secondary | ICD-10-CM | POA: Diagnosis not present

## 2024-01-25 DIAGNOSIS — M6281 Muscle weakness (generalized): Secondary | ICD-10-CM | POA: Diagnosis not present

## 2024-01-29 DIAGNOSIS — M7580 Other shoulder lesions, unspecified shoulder: Secondary | ICD-10-CM | POA: Diagnosis not present

## 2024-01-29 DIAGNOSIS — M6281 Muscle weakness (generalized): Secondary | ICD-10-CM | POA: Diagnosis not present

## 2024-01-29 DIAGNOSIS — M25512 Pain in left shoulder: Secondary | ICD-10-CM | POA: Diagnosis not present

## 2024-01-29 DIAGNOSIS — H539 Unspecified visual disturbance: Secondary | ICD-10-CM | POA: Diagnosis not present

## 2024-02-04 DIAGNOSIS — M7580 Other shoulder lesions, unspecified shoulder: Secondary | ICD-10-CM | POA: Diagnosis not present

## 2024-02-04 DIAGNOSIS — M25512 Pain in left shoulder: Secondary | ICD-10-CM | POA: Diagnosis not present

## 2024-02-04 DIAGNOSIS — M6281 Muscle weakness (generalized): Secondary | ICD-10-CM | POA: Diagnosis not present

## 2024-02-09 DIAGNOSIS — M6281 Muscle weakness (generalized): Secondary | ICD-10-CM | POA: Diagnosis not present

## 2024-02-09 DIAGNOSIS — M7580 Other shoulder lesions, unspecified shoulder: Secondary | ICD-10-CM | POA: Diagnosis not present

## 2024-02-09 DIAGNOSIS — M25512 Pain in left shoulder: Secondary | ICD-10-CM | POA: Diagnosis not present

## 2024-02-12 DIAGNOSIS — M6281 Muscle weakness (generalized): Secondary | ICD-10-CM | POA: Diagnosis not present

## 2024-02-12 DIAGNOSIS — M25512 Pain in left shoulder: Secondary | ICD-10-CM | POA: Diagnosis not present

## 2024-02-12 DIAGNOSIS — M7580 Other shoulder lesions, unspecified shoulder: Secondary | ICD-10-CM | POA: Diagnosis not present

## 2024-02-16 DIAGNOSIS — M6281 Muscle weakness (generalized): Secondary | ICD-10-CM | POA: Diagnosis not present

## 2024-02-16 DIAGNOSIS — M7580 Other shoulder lesions, unspecified shoulder: Secondary | ICD-10-CM | POA: Diagnosis not present

## 2024-02-16 DIAGNOSIS — M25512 Pain in left shoulder: Secondary | ICD-10-CM | POA: Diagnosis not present

## 2024-02-19 DIAGNOSIS — M25512 Pain in left shoulder: Secondary | ICD-10-CM | POA: Diagnosis not present

## 2024-02-19 DIAGNOSIS — M7580 Other shoulder lesions, unspecified shoulder: Secondary | ICD-10-CM | POA: Diagnosis not present

## 2024-02-19 DIAGNOSIS — M6281 Muscle weakness (generalized): Secondary | ICD-10-CM | POA: Diagnosis not present

## 2024-02-23 DIAGNOSIS — C61 Malignant neoplasm of prostate: Secondary | ICD-10-CM | POA: Diagnosis not present

## 2024-02-24 DIAGNOSIS — M25512 Pain in left shoulder: Secondary | ICD-10-CM | POA: Diagnosis not present

## 2024-02-24 DIAGNOSIS — M7580 Other shoulder lesions, unspecified shoulder: Secondary | ICD-10-CM | POA: Diagnosis not present

## 2024-02-24 DIAGNOSIS — M6281 Muscle weakness (generalized): Secondary | ICD-10-CM | POA: Diagnosis not present

## 2024-03-01 DIAGNOSIS — N5201 Erectile dysfunction due to arterial insufficiency: Secondary | ICD-10-CM | POA: Diagnosis not present

## 2024-03-01 DIAGNOSIS — C61 Malignant neoplasm of prostate: Secondary | ICD-10-CM | POA: Diagnosis not present

## 2024-03-03 DIAGNOSIS — M6281 Muscle weakness (generalized): Secondary | ICD-10-CM | POA: Diagnosis not present

## 2024-03-03 DIAGNOSIS — M7580 Other shoulder lesions, unspecified shoulder: Secondary | ICD-10-CM | POA: Diagnosis not present

## 2024-03-03 DIAGNOSIS — M25512 Pain in left shoulder: Secondary | ICD-10-CM | POA: Diagnosis not present

## 2024-03-07 DIAGNOSIS — R42 Dizziness and giddiness: Secondary | ICD-10-CM | POA: Diagnosis not present

## 2024-03-07 DIAGNOSIS — Z6831 Body mass index (BMI) 31.0-31.9, adult: Secondary | ICD-10-CM | POA: Diagnosis not present

## 2024-03-10 DIAGNOSIS — M6281 Muscle weakness (generalized): Secondary | ICD-10-CM | POA: Diagnosis not present

## 2024-03-10 DIAGNOSIS — M25512 Pain in left shoulder: Secondary | ICD-10-CM | POA: Diagnosis not present

## 2024-03-10 DIAGNOSIS — M7580 Other shoulder lesions, unspecified shoulder: Secondary | ICD-10-CM | POA: Diagnosis not present

## 2024-03-14 DIAGNOSIS — M6281 Muscle weakness (generalized): Secondary | ICD-10-CM | POA: Diagnosis not present

## 2024-03-14 DIAGNOSIS — M25512 Pain in left shoulder: Secondary | ICD-10-CM | POA: Diagnosis not present

## 2024-03-14 DIAGNOSIS — M7580 Other shoulder lesions, unspecified shoulder: Secondary | ICD-10-CM | POA: Diagnosis not present

## 2024-03-17 DIAGNOSIS — M7580 Other shoulder lesions, unspecified shoulder: Secondary | ICD-10-CM | POA: Diagnosis not present

## 2024-03-17 DIAGNOSIS — M6281 Muscle weakness (generalized): Secondary | ICD-10-CM | POA: Diagnosis not present

## 2024-03-17 DIAGNOSIS — M25512 Pain in left shoulder: Secondary | ICD-10-CM | POA: Diagnosis not present

## 2024-03-21 DIAGNOSIS — M25512 Pain in left shoulder: Secondary | ICD-10-CM | POA: Diagnosis not present

## 2024-03-21 DIAGNOSIS — M6281 Muscle weakness (generalized): Secondary | ICD-10-CM | POA: Diagnosis not present

## 2024-03-21 DIAGNOSIS — M7580 Other shoulder lesions, unspecified shoulder: Secondary | ICD-10-CM | POA: Diagnosis not present

## 2024-03-24 DIAGNOSIS — M7580 Other shoulder lesions, unspecified shoulder: Secondary | ICD-10-CM | POA: Diagnosis not present

## 2024-03-24 DIAGNOSIS — M6281 Muscle weakness (generalized): Secondary | ICD-10-CM | POA: Diagnosis not present

## 2024-03-24 DIAGNOSIS — M25512 Pain in left shoulder: Secondary | ICD-10-CM | POA: Diagnosis not present

## 2024-06-06 DIAGNOSIS — I10 Essential (primary) hypertension: Secondary | ICD-10-CM | POA: Diagnosis not present

## 2024-06-06 DIAGNOSIS — R7301 Impaired fasting glucose: Secondary | ICD-10-CM | POA: Diagnosis not present

## 2024-06-06 DIAGNOSIS — E7849 Other hyperlipidemia: Secondary | ICD-10-CM | POA: Diagnosis not present

## 2024-06-06 DIAGNOSIS — E1165 Type 2 diabetes mellitus with hyperglycemia: Secondary | ICD-10-CM | POA: Diagnosis not present

## 2024-06-07 DIAGNOSIS — H43811 Vitreous degeneration, right eye: Secondary | ICD-10-CM | POA: Diagnosis not present

## 2024-06-14 DIAGNOSIS — E782 Mixed hyperlipidemia: Secondary | ICD-10-CM | POA: Diagnosis not present

## 2024-06-14 DIAGNOSIS — C61 Malignant neoplasm of prostate: Secondary | ICD-10-CM | POA: Diagnosis not present

## 2024-06-14 DIAGNOSIS — Z0001 Encounter for general adult medical examination with abnormal findings: Secondary | ICD-10-CM | POA: Diagnosis not present

## 2024-06-14 DIAGNOSIS — I10 Essential (primary) hypertension: Secondary | ICD-10-CM | POA: Diagnosis not present

## 2024-06-14 DIAGNOSIS — Z6831 Body mass index (BMI) 31.0-31.9, adult: Secondary | ICD-10-CM | POA: Diagnosis not present

## 2024-06-14 DIAGNOSIS — H8113 Benign paroxysmal vertigo, bilateral: Secondary | ICD-10-CM | POA: Diagnosis not present

## 2024-06-14 DIAGNOSIS — E7849 Other hyperlipidemia: Secondary | ICD-10-CM | POA: Diagnosis not present

## 2024-06-14 DIAGNOSIS — Z23 Encounter for immunization: Secondary | ICD-10-CM | POA: Diagnosis not present

## 2024-06-15 DIAGNOSIS — H47012 Ischemic optic neuropathy, left eye: Secondary | ICD-10-CM | POA: Diagnosis not present

## 2024-06-15 DIAGNOSIS — H43811 Vitreous degeneration, right eye: Secondary | ICD-10-CM | POA: Diagnosis not present

## 2024-06-15 DIAGNOSIS — H35372 Puckering of macula, left eye: Secondary | ICD-10-CM | POA: Diagnosis not present

## 2024-06-15 DIAGNOSIS — H31091 Other chorioretinal scars, right eye: Secondary | ICD-10-CM | POA: Diagnosis not present

## 2024-07-22 DIAGNOSIS — H35372 Puckering of macula, left eye: Secondary | ICD-10-CM | POA: Diagnosis not present

## 2024-07-22 DIAGNOSIS — H47012 Ischemic optic neuropathy, left eye: Secondary | ICD-10-CM | POA: Diagnosis not present

## 2024-07-22 DIAGNOSIS — H43811 Vitreous degeneration, right eye: Secondary | ICD-10-CM | POA: Diagnosis not present
